# Patient Record
Sex: Female | Born: 1935 | Race: White | Hispanic: No | Smoking: Never smoker
Health system: Southern US, Community
[De-identification: ages and names within clinical notes are randomized; demographics above are authoritative.]

## PROBLEM LIST (undated history)

## (undated) DIAGNOSIS — N289 Disorder of kidney and ureter, unspecified: Secondary | ICD-10-CM

## (undated) DIAGNOSIS — Z96659 Presence of unspecified artificial knee joint: Secondary | ICD-10-CM

## (undated) DIAGNOSIS — M545 Low back pain, unspecified: Secondary | ICD-10-CM

## (undated) DIAGNOSIS — R531 Weakness: Secondary | ICD-10-CM

## (undated) DIAGNOSIS — T8459XA Infection and inflammatory reaction due to other internal joint prosthesis, initial encounter: Secondary | ICD-10-CM

## (undated) DIAGNOSIS — R6251 Failure to thrive (child): Secondary | ICD-10-CM

## (undated) DIAGNOSIS — F329 Major depressive disorder, single episode, unspecified: Secondary | ICD-10-CM

## (undated) DIAGNOSIS — F32A Depression, unspecified: Secondary | ICD-10-CM

## (undated) DIAGNOSIS — N186 End stage renal disease: Secondary | ICD-10-CM

## (undated) DIAGNOSIS — I10 Essential (primary) hypertension: Secondary | ICD-10-CM

## (undated) DIAGNOSIS — L8991 Pressure ulcer of unspecified site, stage 1: Secondary | ICD-10-CM

## (undated) HISTORY — DX: Infection and inflammatory reaction due to other internal joint prosthesis, initial encounter: T84.59XA

## (undated) HISTORY — DX: Pressure ulcer of unspecified site, stage 1: L89.91

## (undated) HISTORY — PX: REPLACEMENT TOTAL KNEE BILATERAL: SUR1225

## (undated) HISTORY — DX: Presence of unspecified artificial knee joint: Z96.659

## (undated) HISTORY — PX: PERITONEAL SHUNT: SHX2225

## (undated) HISTORY — PX: PATELLECTOMY: SHX1022

## (undated) HISTORY — DX: Essential (primary) hypertension: I10

## (undated) HISTORY — PX: ABDOMINAL HYSTERECTOMY: SHX81

## (undated) HISTORY — DX: End stage renal disease: N18.6

---

## 1998-07-27 ENCOUNTER — Ambulatory Visit (HOSPITAL_COMMUNITY): Admission: RE | Admit: 1998-07-27 | Discharge: 1998-07-27 | Payer: Self-pay | Admitting: Neurological Surgery

## 1998-08-10 ENCOUNTER — Ambulatory Visit (HOSPITAL_COMMUNITY): Admission: RE | Admit: 1998-08-10 | Discharge: 1998-08-10 | Payer: Self-pay | Admitting: Neurological Surgery

## 1998-08-10 ENCOUNTER — Encounter: Payer: Self-pay | Admitting: Neurological Surgery

## 1998-08-24 ENCOUNTER — Ambulatory Visit (HOSPITAL_COMMUNITY): Admission: RE | Admit: 1998-08-24 | Discharge: 1998-08-24 | Payer: Self-pay | Admitting: Neurological Surgery

## 1998-08-24 ENCOUNTER — Encounter: Payer: Self-pay | Admitting: Neurological Surgery

## 1998-09-15 ENCOUNTER — Ambulatory Visit (HOSPITAL_COMMUNITY): Admission: RE | Admit: 1998-09-15 | Discharge: 1998-09-15 | Payer: Self-pay | Admitting: Neurological Surgery

## 1998-09-15 ENCOUNTER — Encounter: Payer: Self-pay | Admitting: Neurological Surgery

## 1998-09-28 ENCOUNTER — Ambulatory Visit (HOSPITAL_COMMUNITY): Admission: RE | Admit: 1998-09-28 | Discharge: 1998-09-28 | Payer: Self-pay | Admitting: Neurosurgery

## 1999-01-14 ENCOUNTER — Encounter: Payer: Self-pay | Admitting: Neurological Surgery

## 1999-01-14 ENCOUNTER — Ambulatory Visit (HOSPITAL_COMMUNITY): Admission: RE | Admit: 1999-01-14 | Discharge: 1999-01-14 | Payer: Self-pay | Admitting: Neurological Surgery

## 1999-02-23 ENCOUNTER — Encounter: Payer: Self-pay | Admitting: Neurological Surgery

## 1999-02-25 ENCOUNTER — Inpatient Hospital Stay (HOSPITAL_COMMUNITY): Admission: RE | Admit: 1999-02-25 | Discharge: 1999-03-03 | Payer: Self-pay | Admitting: Neurological Surgery

## 1999-02-25 ENCOUNTER — Encounter: Payer: Self-pay | Admitting: Neurological Surgery

## 2001-07-10 ENCOUNTER — Encounter (HOSPITAL_COMMUNITY): Admission: RE | Admit: 2001-07-10 | Discharge: 2001-08-09 | Payer: Self-pay | Admitting: Orthopaedic Surgery

## 2006-11-13 ENCOUNTER — Emergency Department (HOSPITAL_COMMUNITY): Admission: EM | Admit: 2006-11-13 | Discharge: 2006-11-14 | Payer: Self-pay | Admitting: Emergency Medicine

## 2006-11-14 ENCOUNTER — Emergency Department (HOSPITAL_COMMUNITY): Admission: EM | Admit: 2006-11-14 | Discharge: 2006-11-14 | Payer: Self-pay | Admitting: Emergency Medicine

## 2007-02-22 ENCOUNTER — Emergency Department (HOSPITAL_COMMUNITY): Admission: EM | Admit: 2007-02-22 | Discharge: 2007-02-22 | Payer: Self-pay | Admitting: Emergency Medicine

## 2007-09-03 ENCOUNTER — Ambulatory Visit (HOSPITAL_COMMUNITY): Admission: RE | Admit: 2007-09-03 | Discharge: 2007-09-03 | Payer: Self-pay | Admitting: Orthopaedic Surgery

## 2007-09-21 ENCOUNTER — Ambulatory Visit (HOSPITAL_COMMUNITY): Payer: Self-pay | Admitting: Orthopaedic Surgery

## 2007-09-21 ENCOUNTER — Encounter (HOSPITAL_COMMUNITY): Admission: RE | Admit: 2007-09-21 | Discharge: 2007-10-17 | Payer: Self-pay | Admitting: Oncology

## 2007-10-08 ENCOUNTER — Inpatient Hospital Stay: Admission: AD | Admit: 2007-10-08 | Discharge: 2007-10-26 | Payer: Self-pay | Admitting: Family Medicine

## 2007-10-26 ENCOUNTER — Inpatient Hospital Stay (HOSPITAL_COMMUNITY): Admission: EM | Admit: 2007-10-26 | Discharge: 2007-10-29 | Payer: Self-pay | Admitting: Emergency Medicine

## 2007-10-29 ENCOUNTER — Inpatient Hospital Stay: Admission: AD | Admit: 2007-10-29 | Discharge: 2007-11-23 | Payer: Self-pay | Admitting: Family Medicine

## 2010-07-21 ENCOUNTER — Inpatient Hospital Stay
Admission: AD | Admit: 2010-07-21 | Discharge: 2010-10-14 | Disposition: A | Discharge status: Other Acute Inpatient Hospital | Payer: Self-pay | Source: Home / Self Care | Attending: Family Medicine | Admitting: Family Medicine

## 2010-07-25 ENCOUNTER — Emergency Department (HOSPITAL_COMMUNITY): Admission: EM | Admit: 2010-07-25 | Discharge: 2010-07-26 | Payer: Self-pay | Admitting: Emergency Medicine

## 2010-08-03 ENCOUNTER — Ambulatory Visit (HOSPITAL_COMMUNITY): Admission: RE | Admit: 2010-08-03 | Discharge: 2010-08-03 | Payer: Self-pay | Admitting: Family Medicine

## 2010-08-26 ENCOUNTER — Ambulatory Visit (HOSPITAL_COMMUNITY): Admission: RE | Admit: 2010-08-26 | Discharge: 2010-08-26 | Payer: Self-pay | Admitting: Family Medicine

## 2010-10-14 ENCOUNTER — Inpatient Hospital Stay (HOSPITAL_COMMUNITY)
Admission: EM | Admit: 2010-10-14 | Discharge: 2010-10-16 | Payer: Self-pay | Source: Home / Self Care | Attending: Family Medicine | Admitting: Family Medicine

## 2010-10-16 ENCOUNTER — Inpatient Hospital Stay
Admission: RE | Admit: 2010-10-16 | Discharge: 2011-03-09 | DRG: 948 | Disposition: A | Discharge status: Home / Self Care | Payer: BC Managed Care – PPO | Source: Ambulatory Visit | Attending: Family Medicine | Admitting: Family Medicine

## 2010-10-16 DIAGNOSIS — Z139 Encounter for screening, unspecified: Principal | ICD-10-CM | POA: Diagnosis present

## 2010-11-05 NOTE — H&P (Signed)
NAME:  [name], [name]              ACCOUNT NO.:  [id number]  MEDICAL RECORD NO.:  [id number]          PATIENT TYPE:  INP  LOCATION:  A327                          FACILITY:  [facility]  PHYSICIAN:  Mila Homer. Sudie Bailey, M.D.DATE OF BIRTH:  1930-09-02  DATE OF ADMISSION:  10/14/2010 DATE OF DISCHARGE:  12/31/2011LH                             HISTORY & PHYSICAL   HISTORY OF PRESENT ILLNESS:  This 77 year old woman was recent admitted to [facility] with hyperkalemia and renal insufficiency.  She had a 3-day hospitalization extending from December 29 through December 31 and was discharged back to the [facility] at that time.  Long-term problems include chronic infection of the left knee.  She has had total knees placed in the knee twice now and both times has developed infection.  She is followed at [facility] for this.  She also has had chronic asthma, benign essential hypertension, lumbago, degenerative joint disease of both knees status post bilateral total knees and now chronic renal insufficiency.  She says she feels well today.  She said she feels she is breathing well, drinking enough fluid and certainly urinating adequately.  She is tired of being in the nursing home.  OBJECTIVE:  She is supine in bed.  She had been having a bath.  She is in no acute distress.  She is well-developed, slightly obese.  Her speech is normal.  Sentence structure is intact.  Her lungs are clear throughout the day.  Her heart is a regular rhythm and rate of about 80. There is no axillary or supraclavicular adenopathy.  Abdomen: Soft without organomegaly, mass or tenderness.  Bowel sounds are normal. There is no edema of the ankles.  She has bilateral vertical scars of the anterior aspects of both knees.  ASSESSMENT: 1. Recent hyperkalemia. 2. Chronically renal insufficiency, question etiology. 3. Anemia probably secondary to chronic renal insufficiency. 4. Status post chronic infection of  the left knee now still on p.o.     antibiotics. 5. Chronic asthma. 6. Benign essential hypertension. 7. Lumbago. 8. Degenerative joint disease of the knees status post bilateral total     knees. 9. Depression.  PLAN:  She is on ampicillin 500 mg b.i.d. until January 28.  I talked to nursing about this and at this point that is to be stopped followed by a visit at [facility] 3 weeks later and if she does well off antibiotics she will be ready for replacement of a total knee in the left.  MEDICATIONS:  She is to continue Advair Diskus puff b.i.d., atenolol 25 mg daily, Celexa 40 mg daily for depression, hydrochlorothiazide 25 mg daily, losartan 50 mg daily, Lufyllin 200 mg daily, oxybutynin 5 mg daily, Vicodin 5/500 q.i.d. for pain, alprazolam 0.25 mg q.h.s. for sleep and albuterol inhaler 2 puffs q.4 h. for breathing.  We are making arrangements for her to see a nephrologist in town.  I have discussed all this with her.     Mila Homer. Sudie Bailey, M.D.     SDK/MEDQ  D:  10/21/2010  T:  10/21/2010  Job:  [id number]  Electronically Signed by John Giovanni M.D. on 11/05/2010  07:09:58 AM

## 2010-11-06 ENCOUNTER — Encounter: Payer: Self-pay | Admitting: Family Medicine

## 2010-11-07 ENCOUNTER — Encounter: Payer: Self-pay | Admitting: Family Medicine

## 2010-11-07 ENCOUNTER — Encounter: Payer: Self-pay | Admitting: Orthopaedic Surgery

## 2010-11-30 ENCOUNTER — Encounter: Payer: Self-pay | Admitting: Nephrology

## 2010-12-04 NOTE — Progress Notes (Signed)
  NAME:  [name], MELUCCI              ACCOUNT NO.:  [id number]  MEDICAL RECORD NO.:  [id number]           PATIENT TYPE:  LOCATION:                                 FACILITY:  PHYSICIAN:  Mila Homer. Sudie Bailey, M.D.DATE OF BIRTH:  1930/03/13  DATE OF PROCEDURE: DATE OF DISCHARGE:                                PROGRESS NOTE   SUBJECTIVE:  Generally she is doing well.  She is getting bored staying in the nursing home, but had no specific complaints except for a little itchiness of the skin.  I talked to her personal nurse, who said that really she is doing quite well.  She is off antibiotics now.  She is also off all pain medications.  She is going to physical therapy.  She sees Dr. Kristian Covey, nephrologist for followup.  OBJECTIVE:  GENERAL:  She is sitting up in bed eating breakfast.  She is in no acute distress, well-developed, well-nourished, oriented and alert. HEART:  Regular rhythm and rate of about 80. LUNGS:  Clear throughout.  She is moving air well. ABDOMEN:  Soft without tenderness. EXTREMITIES:  There is no edema of the right leg.  The left leg is still healing from the infection. SKIN:  Quite dry, a little scaly.  ASSESSMENT: 1. Infected left total knee, status post removal of the total knee     with spacer placed. 2. Asthma. 3. Renal insufficiency, question etiology. 4. Dry skin.  PLAN:  Follow up with the nephrologist for the renal insufficiency and follow up at [facility] for her knee.     Mila Homer. Sudie Bailey, M.D.     SDK/MEDQ  D:  11/27/2010  T:  11/27/2010  Job:  [id number]  Electronically Signed by John Giovanni M.D. on 12/04/2010 05:25:06 AM

## 2010-12-27 LAB — BASIC METABOLIC PANEL
BUN: 59 mg/dL — ABNORMAL HIGH (ref 6–23)
BUN: 61 mg/dL — ABNORMAL HIGH (ref 6–23)
BUN: 66 mg/dL — ABNORMAL HIGH (ref 6–23)
BUN: 76 mg/dL — ABNORMAL HIGH (ref 6–23)
CO2: 17 mEq/L — ABNORMAL LOW (ref 19–32)
CO2: 17 mEq/L — ABNORMAL LOW (ref 19–32)
CO2: 18 mEq/L — ABNORMAL LOW (ref 19–32)
CO2: 18 mEq/L — ABNORMAL LOW (ref 19–32)
Calcium: 8.7 mg/dL (ref 8.4–10.5)
Calcium: 8.8 mg/dL (ref 8.4–10.5)
Calcium: 8.8 mg/dL (ref 8.4–10.5)
Calcium: 8.9 mg/dL (ref 8.4–10.5)
Calcium: 9.1 mg/dL (ref 8.4–10.5)
Chloride: 112 mEq/L (ref 96–112)
Chloride: 112 mEq/L (ref 96–112)
Chloride: 112 mEq/L (ref 96–112)
Chloride: 113 mEq/L — ABNORMAL HIGH (ref 96–112)
Chloride: 113 mEq/L — ABNORMAL HIGH (ref 96–112)
Creatinine, Ser: 3.54 mg/dL — ABNORMAL HIGH (ref 0.4–1.2)
Creatinine, Ser: 3.55 mg/dL — ABNORMAL HIGH (ref 0.4–1.2)
Creatinine, Ser: 3.63 mg/dL — ABNORMAL HIGH (ref 0.4–1.2)
Creatinine, Ser: 3.93 mg/dL — ABNORMAL HIGH (ref 0.4–1.2)
GFR calc Af Amer: 13 mL/min — ABNORMAL LOW (ref >60)
GFR calc Af Amer: 13 mL/min — ABNORMAL LOW (ref >60)
GFR calc Af Amer: 15 mL/min — ABNORMAL LOW (ref >60)
GFR calc Af Amer: 15 mL/min — ABNORMAL LOW (ref >60)
GFR calc Af Amer: 15 mL/min — ABNORMAL LOW (ref >60)
GFR calc non Af Amer: 11 mL/min — ABNORMAL LOW (ref >60)
GFR calc non Af Amer: 11 mL/min — ABNORMAL LOW (ref >60)
GFR calc non Af Amer: 12 mL/min — ABNORMAL LOW (ref >60)
GFR calc non Af Amer: 12 mL/min — ABNORMAL LOW (ref >60)
GFR calc non Af Amer: 12 mL/min — ABNORMAL LOW (ref >60)
Glucose, Bld: 123 mg/dL — ABNORMAL HIGH (ref 70–99)
Glucose, Bld: 92 mg/dL (ref 70–99)
Glucose, Bld: 92 mg/dL (ref 70–99)
Glucose, Bld: 94 mg/dL (ref 70–99)
Potassium: 4.3 mEq/L (ref 3.5–5.1)
Potassium: 4.5 mEq/L (ref 3.5–5.1)
Potassium: 4.7 mEq/L (ref 3.5–5.1)
Potassium: 5.9 mEq/L — ABNORMAL HIGH (ref 3.5–5.1)
Potassium: 6.6 mEq/L (ref 3.5–5.1)
Sodium: 138 mEq/L (ref 135–145)
Sodium: 138 mEq/L (ref 135–145)
Sodium: 139 mEq/L (ref 135–145)
Sodium: 139 mEq/L (ref 135–145)
Sodium: 139 mEq/L (ref 135–145)

## 2010-12-27 LAB — COMPREHENSIVE METABOLIC PANEL
Albumin: 3.1 g/dL — ABNORMAL LOW (ref 3.5–5.2)
BUN: 82 mg/dL — ABNORMAL HIGH (ref 6–23)
CO2: 18 mEq/L — ABNORMAL LOW (ref 19–32)
Calcium: 9.2 mg/dL (ref 8.4–10.5)
Chloride: 112 mEq/L (ref 96–112)
Creatinine, Ser: 4.25 mg/dL — ABNORMAL HIGH (ref 0.4–1.2)
GFR calc non Af Amer: 10 mL/min — ABNORMAL LOW (ref >60)
Potassium: 6.9 mEq/L (ref 3.5–5.1)
Sodium: 138 mEq/L (ref 135–145)
Total Protein: 7.5 g/dL (ref 6.0–8.3)

## 2010-12-27 LAB — CBC: RDW: 18.5 % — ABNORMAL HIGH (ref 11.5–15.5)

## 2010-12-27 LAB — DIFFERENTIAL
Basophils Absolute: 0 10*3/uL (ref 0.0–0.1)
Basophils Relative: 0 % (ref 0–1)
Eosinophils Relative: 4 % (ref 0–5)
Monocytes Relative: 7 % (ref 3–12)

## 2010-12-27 LAB — CREATININE, URINE, RANDOM: Creatinine, Urine: 45.28 mg/dL

## 2010-12-27 LAB — SODIUM, URINE, RANDOM: Sodium, Ur: 93 mEq/L

## 2010-12-30 LAB — BASIC METABOLIC PANEL
CO2: 28 mEq/L (ref 19–32)
GFR calc non Af Amer: 41 mL/min — ABNORMAL LOW (ref >60)
Glucose, Bld: 136 mg/dL — ABNORMAL HIGH (ref 70–99)
Potassium: 3.8 mEq/L (ref 3.5–5.1)

## 2010-12-30 LAB — DIFFERENTIAL
Basophils Absolute: 0.2 10*3/uL — ABNORMAL HIGH (ref 0.0–0.1)
Eosinophils Absolute: 0.6 10*3/uL (ref 0.0–0.7)
Monocytes Absolute: 1.1 10*3/uL — ABNORMAL HIGH (ref 0.1–1.0)
Monocytes Relative: 6 % (ref 3–12)

## 2010-12-30 LAB — CBC
MCHC: 33.3 g/dL (ref 30.0–36.0)
MCV: 91.6 fL (ref 78.0–100.0)
Platelets: 387 10*3/uL (ref 150–400)
WBC: 18.4 10*3/uL — ABNORMAL HIGH (ref 4.0–10.5)

## 2011-02-02 ENCOUNTER — Encounter (HOSPITAL_COMMUNITY): Payer: Medicare Other | Attending: Family Medicine

## 2011-02-02 ENCOUNTER — Other Ambulatory Visit: Payer: Self-pay | Admitting: Family Medicine

## 2011-02-02 DIAGNOSIS — N189 Chronic kidney disease, unspecified: Secondary | ICD-10-CM | POA: Insufficient documentation

## 2011-02-02 DIAGNOSIS — I129 Hypertensive chronic kidney disease with stage 1 through stage 4 chronic kidney disease, or unspecified chronic kidney disease: Secondary | ICD-10-CM | POA: Insufficient documentation

## 2011-02-02 DIAGNOSIS — D631 Anemia in chronic kidney disease: Secondary | ICD-10-CM | POA: Insufficient documentation

## 2011-02-02 DIAGNOSIS — Z139 Encounter for screening, unspecified: Secondary | ICD-10-CM | POA: Diagnosis present

## 2011-02-03 ENCOUNTER — Encounter (HOSPITAL_COMMUNITY): Payer: Medicare Other

## 2011-02-03 ENCOUNTER — Encounter (HOSPITAL_COMMUNITY): Payer: BC Managed Care – PPO

## 2011-02-04 LAB — CROSSMATCH
ABO/RH(D): O POS
Antibody Screen: NEGATIVE
Unit division: 0
Unit division: 0

## 2011-02-17 ENCOUNTER — Ambulatory Visit (HOSPITAL_COMMUNITY): Admission: RE | Admit: 2011-02-17 | Payer: Medicare Other | Source: Ambulatory Visit | Admitting: Vascular Surgery

## 2011-02-17 ENCOUNTER — Ambulatory Visit (HOSPITAL_COMMUNITY)
Admission: RE | Admit: 2011-02-17 | Discharge: 2011-02-17 | Disposition: A | Discharge status: Home / Self Care | Payer: Medicare Other | Source: Ambulatory Visit | Attending: Vascular Surgery | Admitting: Vascular Surgery

## 2011-02-17 ENCOUNTER — Ambulatory Visit (HOSPITAL_COMMUNITY): Payer: Medicare Other | Attending: Vascular Surgery

## 2011-02-17 DIAGNOSIS — I120 Hypertensive chronic kidney disease with stage 5 chronic kidney disease or end stage renal disease: Secondary | ICD-10-CM

## 2011-02-17 DIAGNOSIS — N186 End stage renal disease: Secondary | ICD-10-CM

## 2011-02-17 DIAGNOSIS — I7090 Unspecified atherosclerosis: Secondary | ICD-10-CM | POA: Insufficient documentation

## 2011-02-17 DIAGNOSIS — Z9889 Other specified postprocedural states: Secondary | ICD-10-CM | POA: Insufficient documentation

## 2011-02-17 DIAGNOSIS — I517 Cardiomegaly: Secondary | ICD-10-CM | POA: Insufficient documentation

## 2011-02-17 LAB — POCT I-STAT 4, (NA,K, GLUC, HGB,HCT)
Glucose, Bld: 86 mg/dL (ref 70–99)
Potassium: 5.8 mEq/L — ABNORMAL HIGH (ref 3.5–5.1)
Sodium: 138 mEq/L (ref 135–145)

## 2011-02-17 LAB — SURGICAL PCR SCREEN: Staphylococcus aureus: POSITIVE — AB

## 2011-02-19 ENCOUNTER — Emergency Department (HOSPITAL_COMMUNITY): Payer: Medicare Other

## 2011-02-19 ENCOUNTER — Inpatient Hospital Stay (HOSPITAL_COMMUNITY): Payer: Medicare Other

## 2011-02-19 ENCOUNTER — Ambulatory Visit (HOSPITAL_COMMUNITY)
Admission: EM | Admit: 2011-02-19 | Discharge: 2011-02-19 | Disposition: A | Discharge status: Home / Self Care | Payer: Medicare Other | Source: Ambulatory Visit | Attending: Emergency Medicine | Admitting: Emergency Medicine

## 2011-02-19 DIAGNOSIS — J449 Chronic obstructive pulmonary disease, unspecified: Secondary | ICD-10-CM | POA: Insufficient documentation

## 2011-02-19 DIAGNOSIS — Z79899 Other long term (current) drug therapy: Secondary | ICD-10-CM | POA: Insufficient documentation

## 2011-02-19 DIAGNOSIS — Z96659 Presence of unspecified artificial knee joint: Secondary | ICD-10-CM | POA: Insufficient documentation

## 2011-02-19 DIAGNOSIS — J4489 Other specified chronic obstructive pulmonary disease: Secondary | ICD-10-CM | POA: Insufficient documentation

## 2011-02-19 DIAGNOSIS — I120 Hypertensive chronic kidney disease with stage 5 chronic kidney disease or end stage renal disease: Secondary | ICD-10-CM | POA: Insufficient documentation

## 2011-02-19 DIAGNOSIS — N186 End stage renal disease: Secondary | ICD-10-CM | POA: Insufficient documentation

## 2011-02-19 DIAGNOSIS — M1990 Unspecified osteoarthritis, unspecified site: Secondary | ICD-10-CM | POA: Insufficient documentation

## 2011-02-19 DIAGNOSIS — R0602 Shortness of breath: Secondary | ICD-10-CM | POA: Insufficient documentation

## 2011-02-19 DIAGNOSIS — I509 Heart failure, unspecified: Secondary | ICD-10-CM | POA: Insufficient documentation

## 2011-02-19 LAB — BASIC METABOLIC PANEL
BUN: 41 mg/dL — ABNORMAL HIGH (ref 6–23)
CO2: 24 mEq/L (ref 19–32)
Chloride: 103 mEq/L (ref 96–112)
Creatinine, Ser: 3.49 mg/dL — ABNORMAL HIGH (ref 0.4–1.2)
Glucose, Bld: 92 mg/dL (ref 70–99)
Potassium: 5 mEq/L (ref 3.5–5.1)

## 2011-02-19 LAB — CBC
HCT: 30.1 % — ABNORMAL LOW (ref 36.0–46.0)
Hemoglobin: 9.7 g/dL — ABNORMAL LOW (ref 12.0–15.0)
MCH: 30.4 pg (ref 26.0–34.0)
MCV: 94.4 fL (ref 78.0–100.0)
RBC: 3.19 MIL/uL — ABNORMAL LOW (ref 3.87–5.11)
WBC: 17.2 10*3/uL — ABNORMAL HIGH (ref 4.0–10.5)

## 2011-02-19 LAB — PRO B NATRIURETIC PEPTIDE: Pro B Natriuretic peptide (BNP): 5328 pg/mL — ABNORMAL HIGH (ref 0–450)

## 2011-02-19 LAB — DIFFERENTIAL
Lymphocytes Relative: 4 % — ABNORMAL LOW (ref 12–46)
Lymphs Abs: 0.7 10*3/uL (ref 0.7–4.0)
Monocytes Relative: 5 % (ref 3–12)
Neutrophils Relative %: 87 % — ABNORMAL HIGH (ref 43–77)

## 2011-02-19 LAB — POCT CARDIAC MARKERS: Troponin i, poc: <0.05 ng/mL (ref 0.00–0.09)

## 2011-02-22 NOTE — Op Note (Signed)
  NAME:  [name], [name]              ACCOUNT NO.:  [id number]  MEDICAL RECORD NO.:  [id number]           PATIENT TYPE:  O  LOCATION:  SDS                          FACILITY:  [facility]  PHYSICIAN:  Di Kindle. Edilia Bo, M.D.DATE OF BIRTH:  [birth year]  DATE OF PROCEDURE:  02/17/2011 DATE OF DISCHARGE:                              OPERATIVE REPORT   PREOPERATIVE DIAGNOSIS:  Chronic kidney disease.  POSTOPERATIVE DIAGNOSIS:  Chronic kidney disease.  PROCEDURE:  Ultrasound-guided placement of right IJ 19-cm Diatek catheter.  SURGEON:  Di Kindle. Edilia Bo, MD  ANESTHESIA:  Local with sedation.  TECHNIQUE:  The patient was taken to the operating room, sedated by Anesthesia.  The neck and upper chest were prepped and draped in usual sterile fashion.  After the skin was anesthetized with 1% lidocaine and with the patient in Trendelenburg and under ultrasound guidance, the right IJ was cannulated and the guidewire introduced into the superior vena cava under fluoroscopic control.  The track over the wire was dilated, and the dilator and peel-away sheath were advanced over the wire and the wire and dilator were removed.  Catheter was passed through the peel-away sheath and positioned in the right atrium.  The exit site in the catheter was selected, and the skin anesthetized between the two areas.  The catheter was then brought through the tunnel, cut to the appropriate length, and the distal ports were attached.  Both ports withdrew easily.  We then flushed with heparinized saline and filled with concentrated heparin.  The catheter was secured at its exit site with 3-0 nylon suture.  The IJ cannulation site was closed with a 4-0 subcuticular stitch.  Sterile dressing was applied.  The patient tolerated the procedure well, was transferred to the recovery room in stable condition.  All needle and sponge counts were correct.     Di Kindle. Edilia Bo, M.D.     CSD/MEDQ  D:   02/17/2011  T:  02/17/2011  Job:  [id number]  Electronically Signed by Waverly Ferrari M.D. on 02/22/2011 04:54:19 PM

## 2011-03-01 NOTE — Group Therapy Note (Signed)
NAME:  [name], [name]              ACCOUNT NO.:  [id number]   MEDICAL RECORD NO.:  [id number]          PATIENT TYPE:  INP   LOCATION:  A215                          FACILITY:  [facility]   PHYSICIAN:  Edward L. Juanetta Gosling, M.D.DATE OF BIRTH:  [birth year]   DATE OF PROCEDURE:  DATE OF DISCHARGE:                                 PROGRESS NOTE   SUBJECTIVE:  [name] is better.  Says she is feeling a little better.  She has no new complaints.   OBJECTIVE:  Shows she is awake, alert.  Temperature is 97.4, pulse 84,  respirations 20, blood pressure 161/83, O2 sats 98% on room air.  Her B-  met shows potassium is 4.2, BUN 13, creatinine 0.92, so she is back to  normal with that.  Her white count 7900, hemoglobin is 8.6.   ASSESSMENT:  She is better.  Her potassium is better.   PLAN:  To see if perhaps she can get back to the nursing home soon.  She  is still on diltiazem drip which I am going to discontinue.      Edward L. Juanetta Gosling, M.D.  Electronically Signed     ELH/MEDQ  D:  10/28/2007  T:  10/29/2007  Job:  [id number]

## 2011-03-01 NOTE — H&P (Signed)
NAME:  [name], [name]              ACCOUNT NO.:  [id number]   MEDICAL RECORD NO.:  [id number]          PATIENT TYPE:  ORB   LOCATION:  S102                          FACILITY:  [facility]   PHYSICIAN:  Mila Homer. Sudie Bailey, M.D.DATE OF BIRTH:  03/24/1930   DATE OF ADMISSION:  10/29/2007  DATE OF DISCHARGE:  LH                              HISTORY & PHYSICAL   HISTORY OF PRESENT ILLNESS:  This 77-year was readmitted from [facility] nursing home after three hospitalization at [facility]  for severe hyperkalemia.  She had potassium of 7.1 at that time.   She is now back in the nursing doing well.   Her history includes hypertension, asthma, and DJD of the knees.  She  had bilateral knee surgeries and recently infected left total knee which  required operative treatment at [facility] with removal of the  hardware in the knee and now 6 weeks of IV vancomycin.   She has also had a chronic anemia and chronic pain due to lumbar disk  disease.   After leaving the hospital she was continued on amlodipine 10 mg daily.  She had been on amlodipine 10/benazepril 20 daily prior to this and had  not been on ACE  prior to the time at the nursing home.  Benazepril was  discontinued.  She was also put on HCTZ 50 mg daily in an attempt to  keep her potassium down also to control her blood pressure.  She was  continued with vancomycin as above at [place] twice a day.   PHYSICAL EXAMINATION:  GENERAL:  She is well-developed, well-nourished  oriented and alert.  No acute distress.  VITAL SIGNS:  Temperature is 98.7, pulse 70, BP 137/50, respiratory rate  20.  LUNGS:  Her lungs are clear throughout moving air well.  HEART:  A regular rhythm with a rate of 70.  ABDOMEN:  Soft without organomegaly or mass.  There is no axillary or  supraclavicular adenopathy.  SKIN:  Skin turgor is normal.  Mucous membranes are moist.  NEUROLOGIC:  She is a good historian and affect is normal.  EXTREMITIES:   There is no edema of the ankles.  There is a well-healing  surgical incision of the anterior surface of the left knee.   LABORATORY DATA:  Recheck potassium yesterday was 3.6.  A random glucose  was 142 and review of the hospital January 101, 151 and 148.   ADMISSION DIAGNOSES:  1. Infection of the left total knee, now on 6 weeks of IV vancomycin.  2. Hyperkalemia, resolved and probably secondary to the use of      benazepril as an antihypertensive.  3. Chronic asthma.  4. Hyperglycemia.  5. Chronic anemia.  6. Benign essential hypertension.  7. Degenerative joint disease in the knees.  8. Lumbago secondary to degenerative disease of the lumbar spine.   PLAN:  Will continue her on the amlodipine 10 mg daily and HCTZ 50 mg  daily.  Use theophylline twice a day for her asthma.  She will be on IV  vancomycin as per pharmacy.  Given the persistent  elevated glucoses we  are getting an A1c on her.  Will repeat her potassium level in 2 days  and make sure it is staying down.  I discussed all this with the patient  and nursing.      Mila Homer. Sudie Bailey, M.D.  Electronically Signed     SDK/MEDQ  D:  11/01/2007  T:  11/01/2007  Job:  [id number]

## 2011-03-01 NOTE — H&P (Signed)
NAME:  [name], [name]              ACCOUNT NO.:  [id number]   MEDICAL RECORD NO.:  [id number]          PATIENT TYPE:  ORB   LOCATION:  S102                          FACILITY:  [facility]   PHYSICIAN:  Mila Homer. Sudie Bailey, M.D.DATE OF BIRTH:  [birth year]   DATE OF ADMISSION:  10/08/2007  DATE OF DISCHARGE:  LH                              HISTORY & PHYSICAL   This 77 year old woman was seen in the office about 3 or 4 weeks ago.  She had an area near her left knee which had become erythematous and  swollen, measured in size about a quarter. It was I&D'd in the office  and a copious amount of pus came from this. Given her history of left  total knee arthroplasty, it was recommended she be seen the following  day at which point I was going to take her C&S and refer it to  orthopedics. The patient did not come back that week however and it was  only the following week I was able to see her at which time again she  was still draining and I referred her to Dr. Darreld Mclean, orthopedist  in town.   She has a long and complicated medical history. She has had anxiety and  some depression. Her husband of 56 years just died 3 months ago. He had  MS and was taken care of at a rest home run by his church. She has  family in town.   She also has long-term asthma. She has been on Lufyllin for this.   After getting Vancomycin IV for 2 or 3 days outpatient, she was  transferred to [facility] and there they resected the hardware in  the left knee and also put an antibiotic space in place.   Today she is in no acute distress, oriented and alert, a well-developed  woman sitting up in bed. Her sentence structure is intact. The patient's  sensorium appears to be normal. She really has no pain at present.  Mucous membranes are moist, skin turgor is normal, there is no axillary  or supraclavicular adenopathy. Negative anterior cervical nodes.  ENT:  Appeared normal.  HEART:  Regular rhythm and rate of  80.  LUNGS:  Clear, moving air well. There were no intercostal retractions or  use of accessory muscles for respiration. There was no wheezing despite  her long history of asthma.  ABDOMEN:  Soft and obese without hepatosplenomegaly or mass or  tenderness.  EXTREMITIES:  She had a left knee immobilizer in place and no distal  edema of the ankles.   ASSESSMENT:  1. Degenerative joint disease status post left total knee.  2. Infection left total knee status post removal of the hardware at      [facility].  3. Right total knee.  4. Chronic asthma.  5. Anxiety.  6. Hypertension.  7. Constipation.   I recommended she getting on Lufyllin or similar product 200-300 mg  b.i.d. She is on Xopenex MDI 2 puffs q.4 h p.r.n. She is on p.r.n.  acetaminophen. She is on IV Vancomycin with Vancomycin level to be done  daily.  She is on Lotrel 5/20 daily for hypertension, aspirin 325 mg  daily, Colace 100 mg daily, doxicycline 100 mg b.i.d., Advair diskus  100/50 puff b.i.d. She has a PICC line.   Followup will be with orthopedics and ID at [facility] in several  weeks.      Mila Homer. Sudie Bailey, M.D.  Electronically Signed     SDK/MEDQ  D:  10/10/2007  T:  10/10/2007  Job:  [id number]

## 2011-03-01 NOTE — Discharge Summary (Signed)
NAME:  [name], [name]              ACCOUNT NO.:  [id number]   MEDICAL RECORD NO.:  [id number]          PATIENT TYPE:  INP   LOCATION:  A215                          FACILITY:  [facility]   PHYSICIAN:  Mila Homer. Sudie Bailey, M.D.DATE OF BIRTH:  Jul 21, 1930   DATE OF ADMISSION:  DATE OF DISCHARGE:  LH                               DISCHARGE SUMMARY   This 77 year old woman was admitted to the hospital with hyperkalemia.  She had a benign 4-day hospitalization extending from October 26, 2007 to  October 29, 2007.  Vital signs remain stable.   Admission CBC showed an H&H of 8.9/27.3, and a BMET:  potassium 7.3,  glucose 113, BUN 33, creatinine 1.31.  Cardiac markers were negative,  and recheck potassium later that afternoon was 6.7.  Following day, the  hemoglobin was stable at 8.9 and the potassium was down to 5.5.  Potassium then dropped to 4.2 the day prior to discharge, and the day of  discharge it was 3.6.   She was admitted to the hospital, kept on a vancomycin protocol.  She  had been on amlodipine 10/benazepril 20.  The benazepril was stopped and  she put on amlodipine 10 mg daily, and HCTZ 50 mg daily added for blood  pressure control.  We kept her on __________  200 mg b.i.d., aspirin 325  mg daily, Colace 100 mg daily, and she had p.r.n., hydrocodone and  acetaminophen, Ambien 10 mg nightly for sleep, and Benadryl 25 mg q.  shift for itching.   In the emergency room, she was given D50 IV with IV insulin and calcium.  She was also given 60 grams of Kayexalate.  She had 40-g dose of  Kayexalate given at 6 p.m. that night when her potassium was still up at  6.7.  She is on IV of normal saline 75 mL/hour.   Shortly after this she, developed what appeared to be an atrial  tachycardia, then atrial fibrillation, so she was put on a Cardizem  drip.  She required another 60 g of Kayexalate the following day, and  that was given on Cardizem by protocol.  Cardizem drip was discontinued  her  third hospital day.   She was doing well on fourth hospital day.  Potassium down to 3.6.  She  felt well, but her blood pressure was somewhat elevated at 178/88.  Other vitals were stable.  She was felt to be ready to return back to  the [facility].   She is to continue with her aspirin 325 mg daily, Colace 100 mg daily,  MVI with minerals daily, Mylanta 30 mL q.4 hours for heartburn,  Phenergan 25 mg suppository q.4 hours for nausea, Ambien 10 mg nightly  for sleep, Benadryl 25 mg q. shift as needed, and vancomycin 750 mg IV  every 36 hours by protocol.  She is also to be on hydrocodone APAP  7.5/650 one to two every 4 hours for pain.  Instead of continuing her on  Lotrel 10/20, we are stopped the benazepril, continuing amlodipine 10 mg  daily, and HCTZ 50 mg daily.  We  will check her potassium level in 2  days and 4 days.   FINAL DISCHARGE DIAGNOSIS:  1. Hyperkalemia possibly secondary to benazepril.  2. Benign essential hypertension.  3. Degenerative joint disease of the knee, status post a left total      knee, and then left total knee infection with removal of the      hardware from this last month.  4. Chronic asthma.  5. Chronic anemia.  6. Chronic pain.      Mila Homer. Sudie Bailey, M.D.  Electronically Signed     SDK/MEDQ  D:  10/29/2007  T:  10/29/2007  Job:  [id number]

## 2011-03-01 NOTE — Discharge Summary (Signed)
NAME:  [name], [name]              ACCOUNT NO.:  [id number]   MEDICAL RECORD NO.:  [id number]          PATIENT TYPE:  ORB   LOCATION:  S102                          FACILITY:  [facility]   PHYSICIAN:  Mila Homer. Sudie Bailey, M.D.DATE OF BIRTH:  12/23/1929   DATE OF ADMISSION:  10/29/2007  DATE OF DISCHARGE:  LH                               DISCHARGE SUMMARY   A 77 year old woman was admitted to the nursing center in December in  recuperation from removal of infected left total knee prosthesis.  She  required admission to [facility] for three-day course starting  October 29, 2007, due to hyperkalemia, which was treated and resolved.  She has done well since then.   She had a month long course of IV vancomycin, was seen by orthopedist  recently and after that course was done, she was switched to Bactrim DS  two tablets b.i.d., which she currently is on.  She is at the point  where she will be ready to be discharged home this week with the family  to help at home.   CURRENT MEDICATIONS:  1. Aspirin 325 mg daily.  2. Colace 100 mg daily.  3. Multivitamin daily.  4. Amlodipine 10 mg daily.  5. Hydrochlorothiazide 50 mg daily.  6. Lufyllin 200 mg b.i.d.  7. Advair discus 100/50 puff b.i.d.  8. Benadryl 25 mg q.8h. for itching.  9. Lorcet Plus 7.5/650 q.4h. for pain.   PHYSICAL EXAMINATION:  GENERAL APPEARANCE:  A pleasant, elderly woman  who was in no acute distress.  She was well-developed, well-nourished.  Sensorium intact.  Sentence structure normal.  Brief exam normal.   DISCHARGE DIAGNOSIS:  1. Infection of the left total knee, now status post treatment with      six weeks of IV vancomycin and start of a month-long treatment of      Bactrim DS two b.i.d.  2. Hyperkalemia, probably secondary to ACE inhibition.  3. Chronic asthma.  4. Hyperglycemia.  5. Chronic anemia.  6. Benign essential hypertension.  7. Degenerative joint disease in knees.  8. Lumbago secondary to  degenerative disease of the lumbar spine.   DISCHARGE MEDICATIONS:  She is discharged home on meds as noted.  1. She is to stop her Lotrel which she currently has at home.  2. I have written RX for amlodipine 10 mg daily (75 refills), and      Bactrim DS 2 tablets b.i.d. (120 RF x0).   FOLLOW UP:  She is to follow-up with me in the office within a month and  will be seen by the doctors at [facility] on December 03, 2007.  She will  need another total knee surgery once the infection has totally calmed  down and she gets clearance from her orthopedist.  Now also to make a  change in the dictation above.      Mila Homer. Sudie Bailey, M.D.  Electronically Signed     SDK/MEDQ  D:  11/20/2007  T:  11/21/2007  Job:  [id number]

## 2011-03-01 NOTE — H&P (Signed)
NAME:  [name], [name]              ACCOUNT NO.:  [id number]   MEDICAL RECORD NO.:  [id number]          PATIENT TYPE:  INP   LOCATION:  A215                          FACILITY:  [facility]   PHYSICIAN:  Mila Homer. Sudie Bailey, M.D.DATE OF BIRTH:  1930-07-29   DATE OF ADMISSION:  10/26/2007  DATE OF DISCHARGE:  LH                              HISTORY & PHYSICAL   HISTORY OF PRESENT ILLNESS:  This is a 78 year old woman resident of the  [facility] had a routine CBC and MET 7 done today and was  found to have a potassium of 7.1.  This was repeated and was 7.2.   She is currently a resident at the nursing home short stay since she  developed infection of a left total knee roughly a month ago.  She had  the hardware removed at [facility] and currently is convalescing on IV  vancomycin.   She has had asthma, lifelong.  He also had hypertension.   Currently, she is on Lotrel 10/20 daily increased from Lotrel 5/20 daily  recently.  She is also on hydrocodone/acetaminophen for pain and  albuterol MDI.   She has had a long history of DJD of the knees before she had the left  total knee.  There is no history of alcohol or tobacco.  She has had a  number of trips to the [facility] ER in the last year including one for a  facial laceration in January 2008, and another for fracture of the left  rib in May 2008.   Currently, she is getting physical therapy using a walker at the nursing  facility.   REVIEW OF SYSTEMS:  She has had general fatigue for the last few days to  a week.  There is no specific pain other than the left knee.  She has a  history of hypertension.   PHYSICAL EXAMINATION:  VITAL SIGNS:  Temperature is 98 degrees, blood  pressure 161/41, pulse 95, respiratory 20, O2 saturations 95%.  GENERAL:  She is oriented and alert in no acute distress.  Her speech  was normal.  Sentence structure intact.  She appeared to be a good  historian.  HEENT:  Mucous membranes are moist.  Skin  turgor was normal.  There is  no axillary supraclavicular adenopathy.  HEART:  Regular rhythm and rate of 70.  LUNGS:  Clear throughout.  ABDOMEN:  Soft without hepatosplenomegaly or mass.  EXTREMITIES:  Left knee still and a draining catheter.   LABORATORY DATA AND X-RAY FINDINGS:  Her white cell count is 8100 of  which 73% are neutrophils, 10 lymphs, H&H 8.9 and 27.3.  Her BMET showed  potassium 7.3, BUN 33, creatinine 1.31, glucose 113.   IMPRESSION:  1. Severe hyperkalemia, questionable etiology.  This may be to some      extent secondary to slightly decreased renal function and the use      of benazepril.  2. Anemia, which is chronic with a hemoglobin 9.8 on December 5.  3. Benign essential hypertension.  4. Chronic asthma.  5. Degenerative joint disease of the knees.  6.  Chronic pain.   PLAN:  The patient was treated in the emergency department by Dr.  Colon Branch, the ER physician, with D-50 insulin and calcium.  She also  received 60 g of Kayexalate.  She is asymptomatic now, admitted on a  monitor and repeat potassium is pending.  Once her potassium is under  control and we understand the etiology, she will be able to return back  to the nursing center.      Mila Homer. Sudie Bailey, M.D.  Electronically Signed     SDK/MEDQ  D:  10/26/2007  T:  10/27/2007  Job:  [id number]

## 2011-03-01 NOTE — Group Therapy Note (Signed)
NAME:  [name], [name]              ACCOUNT NO.:  [id number]   MEDICAL RECORD NO.:  [id number]          PATIENT TYPE:  INP   LOCATION:  A215                          FACILITY:  [facility]   PHYSICIAN:  Edward L. Juanetta Gosling, M.D.DATE OF BIRTH:  [birth year]   DATE OF PROCEDURE:  10/27/2007  DATE OF DISCHARGE:                                 PROGRESS NOTE   A patient of Dr. Mila Homer. Knowlton's.  [name] was at the [facility] for rehab for knee replacement and then her knee got infected and  had to have all that removed and she is there for rehab.  She was found  to have an elevated potassium level.  Since then she has developed what  appears to be a paroxysmal atrial tachycardia.   Her temperature is 97.9, pulse 100, respirations 20, blood pressure  138/98, O2 sats 96%.  Her heart rate went to about 150.  She did receive  Cardizem and it has made some difference.  This morning her hemoglobin  level is 8.9, yesterday it was 8.9 so little change.  Her potassium is  down to 5.5 from about 7, BUN is 23, creatinine 1.04 so I am not sure  why she is having such an elevation of her potassium.  She is not on  potassium sparing diuretics.  She is not on an ACE or an ARB.  She is on  vancomycin.   ASSESSMENT:  She has infection in her knee.  She is being treated with  vancomycin.  She has atrial arrhythmias being treated with Cardizem.  She has hyperkalemia being treated.  She is going to need another dose  of Kayexalate and then try and decide what we are going to do from  there.  I am going to go ahead and get cardiac enzymes, EKGs serially,  repeat her Kayexalate, repeat her labs in the morning.      Edward L. Juanetta Gosling, M.D.  Electronically Signed     ELH/MEDQ  D:  10/27/2007  T:  10/27/2007  Job:  [id number]

## 2011-03-04 NOTE — Group Therapy Note (Signed)
  NAME:  [name], [name]              ACCOUNT NO.:  [id number]  MEDICAL RECORD NO.:  [id number]           PATIENT TYPE:  LOCATION:                                 FACILITY:  PHYSICIAN:  Mila Homer. Sudie Bailey, M.D.DATE OF BIRTH:  Nov 02, 1929  DATE OF PROCEDURE: DATE OF DISCHARGE:                                PROGRESS NOTE   SUBJECTIVE:  Generally she is doing much better although she has a lot of itching.  She is taking Benadryl for this.  She is being seen by Dr. Kristian Covey, the nephrologist.  She has had increasing BUN and creatinine and he has recommended that she have hemodialysis.  The repeat surgery on the left knee has been put off because of this.  OBJECTIVE:  Today she is sitting up in bed.  She is in no acute distress.  She is well developed and well nourished.  Her speech is normal.  Sensorium intact.  Her heart has a regular rhythm and rate of about 70.  Lungs are clear throughout.  There is no edema of the ankles.  Her recent BUN was 81 with a creatinine of 5.81.  She has also had hyperkalemia.  ASSESSMENT: 1. Infection of the left total knee, now status post removal of the     total knee hardware and awaiting repeat surgery with another total     knee to the left. 2. Chronic asthma. 3. Chronic renal insufficiency, question etiology. 4. Hyperkalemia. 5. Benign essential hypertension.  Dr. Kristian Covey has stopped her Cozaar.  She was on Cozaar 50 mg daily with atenolol 25 mg daily and hydrochlorothiazide 25 mg daily.  Hopefully that has helped her.  He has also put her on Kayexalate 30 g weekly for her hyperkalemia.  We are checking to see if she has had a renal ultrasound within the last six months.  We will arrange this if not done.  Continue the Benadryl for itching.     Mila Homer. Sudie Bailey, M.D.     SDK/MEDQ  D:  01/21/2011  T:  01/21/2011  Job:  [id number]  Electronically Signed by John Giovanni M.D. on 03/04/2011 06:55:18 AM

## 2011-03-04 NOTE — Group Therapy Note (Signed)
  NAME:  [name], [name]              ACCOUNT NO.:  [id number]  MEDICAL RECORD NO.:  [id number]           PATIENT TYPE:  LOCATION:                                 FACILITY:  PHYSICIAN:  Mila Homer. Sudie Bailey, M.D.DATE OF BIRTH:  06/25/1930  DATE OF PROCEDURE: DATE OF DISCHARGE:                                PROGRESS NOTE   SUBJECTIVE:  She is feeling about the same.  She is now going to dialysis 3 times a week.  OBJECTIVE:  She is sitting up in bed.  She looks happy.  She is no acute distress.  Well developed, well nourished. HEART:  Has a regular rhythm, rate of about 70. LUNGS:  Clear throughout.  She is moving air well.  Color is good and there is no edema of the ankles.  ASSESSMENT: 1. Chronic infection of the left knee, now awaiting a left total knee. 2. Chronic renal insufficiency, cause unknown.  PLAN:  I spent a lot of time today talking to her about her kidney disease.  We really do not know what has caused this.  I have discussed the case with Dr. Kristian Covey, nephrologist, who has advised me that even if we were to know the specific diagnosis by renal biopsy that would still not affect the patient in that she will need dialysis as treatment.  I discussed all this with the patient.  Hopefully her renal function will improve over time and she will be ready to have her left knee surgery redone and then will be able to be ambulating and return home at some time within the next 3 to 6 months.     Mila Homer. Sudie Bailey, M.D.     SDK/MEDQ  D:  02/25/2011  T:  02/25/2011  Job:  [id number]  Electronically Signed by John Giovanni M.D. on 03/04/2011 06:55:37 AM

## 2011-04-05 NOTE — Discharge Summary (Signed)
  NAME:  [name], SHVARTSMAN              ACCOUNT NO.:  [id number]  MEDICAL RECORD NO.:  [id number]           PATIENT TYPE:  LOCATION:                                 FACILITY:  PHYSICIAN:  Mila Homer. Sudie Bailey, M.D.DATE OF BIRTH:  02/14/1930  DATE OF ADMISSION: DATE OF DISCHARGE:  LH                              DISCHARGE SUMMARY   HOSPITAL COURSE:  This 77 year old woman was treated at the [facility] in [place], [place], initially for an infected left total knee. She had had the hardware from the total knee removed at [facility], and she was on antibiotics at the [facility] while the knee healed, preparatory to having a new knee put in place.  While she was the nursing home, it was noted that she had increasingly worse renal function.  Dr. Kristian Covey, nephrologist, was consulted about this.  Everything was discussed with the patient and eventually, she agreed to hemodialysis which she started while she was in the nursing home.  We never knew the exact cause of this.  Dr. Kristian Covey noted that we could have a renal biopsy and get a specific diagnosis, but it really would not change the treatment at this point.  The patient was stable on dialysis at the time of discharge, and there was no more sign of infection of the left knee.  She was discharged home with a wheelchair, 3-in-1 commode, hospital bed and mechanical lift.  She has 2 sons living in [place] who will be able to help her.  She will be on dialysis every Tuesday, Thursday and Saturday.  DISCHARGE MEDICATIONS: 1. Ditropan 5 mg daily. 2. Lufyllin 200 mg daily. 3. Celexa 40 mg daily. 4. Atenolol 25 mg daily. 5. Tylenol 500 mg 2 tablets b.i.d. 6. Advair 100/50 one puff b.i.d. 7. Benadryl 25 mg 1 q.8 hours for itching. 8. Albuterol inhaler 2 puffs q.4 hours for breathing. 9. Oxycodone 5 mg 1-2 tablets q.4 hours for pain.  At the time of     discharge, she used the oxycodone rarely, and a script was written  for oxycodone 5 mg to take 1-2 tablets q.4 hours (100 with no     refills).  FINAL DISCHARGE DIAGNOSES: 1. Infection of the left total knee, now status post removal of the     total knee hardware with a spacer placed. 2. Chronic renal insufficiency, unknown etiology. 3. Need for hemodialysis. 4. Asthma. 5. Long-term use of pain medication, much improved. 6. Benign essential hypertension. 7. Lumbago. 8. Anemia, multifactorial etiology, felt to be secondary to her     chronic renal insufficiency.  FOLLOWUP:  In my office within several weeks.     Mila Homer. Sudie Bailey, M.D.     SDK/MEDQ  D:  03/08/2011  T:  03/08/2011  Job:  [id number]  Electronically Signed by John Giovanni M.D. on 04/05/2011 07:08:44 AM

## 2011-05-04 ENCOUNTER — Inpatient Hospital Stay (HOSPITAL_COMMUNITY)
Admission: AD | Admit: 2011-05-04 | Discharge: 2011-05-10 | DRG: 592 | Disposition: A | Discharge status: Home Health | Payer: Medicare Other | Attending: Family Medicine | Admitting: Family Medicine

## 2011-05-04 DIAGNOSIS — M545 Low back pain, unspecified: Secondary | ICD-10-CM | POA: Diagnosis not present

## 2011-05-04 DIAGNOSIS — E871 Hypo-osmolality and hyponatremia: Secondary | ICD-10-CM | POA: Diagnosis not present

## 2011-05-04 DIAGNOSIS — R6251 Failure to thrive (child): Secondary | ICD-10-CM | POA: Diagnosis present

## 2011-05-04 DIAGNOSIS — N039 Chronic nephritic syndrome with unspecified morphologic changes: Secondary | ICD-10-CM | POA: Diagnosis present

## 2011-05-04 DIAGNOSIS — M1710 Unilateral primary osteoarthritis, unspecified knee: Secondary | ICD-10-CM | POA: Diagnosis present

## 2011-05-04 DIAGNOSIS — F329 Major depressive disorder, single episode, unspecified: Secondary | ICD-10-CM | POA: Diagnosis present

## 2011-05-04 DIAGNOSIS — IMO0002 Reserved for concepts with insufficient information to code with codable children: Secondary | ICD-10-CM | POA: Diagnosis present

## 2011-05-04 DIAGNOSIS — F341 Dysthymic disorder: Secondary | ICD-10-CM | POA: Diagnosis present

## 2011-05-04 DIAGNOSIS — L8991 Pressure ulcer of unspecified site, stage 1: Secondary | ICD-10-CM | POA: Diagnosis present

## 2011-05-04 DIAGNOSIS — E46 Unspecified protein-calorie malnutrition: Secondary | ICD-10-CM | POA: Diagnosis present

## 2011-05-04 DIAGNOSIS — L89309 Pressure ulcer of unspecified buttock, unspecified stage: Principal | ICD-10-CM | POA: Diagnosis present

## 2011-05-04 DIAGNOSIS — N189 Chronic kidney disease, unspecified: Secondary | ICD-10-CM | POA: Diagnosis present

## 2011-05-04 DIAGNOSIS — J45909 Unspecified asthma, uncomplicated: Secondary | ICD-10-CM | POA: Diagnosis not present

## 2011-05-04 DIAGNOSIS — I120 Hypertensive chronic kidney disease with stage 5 chronic kidney disease or end stage renal disease: Secondary | ICD-10-CM | POA: Diagnosis present

## 2011-05-04 DIAGNOSIS — Z992 Dependence on renal dialysis: Secondary | ICD-10-CM

## 2011-05-04 DIAGNOSIS — N186 End stage renal disease: Secondary | ICD-10-CM | POA: Diagnosis present

## 2011-05-04 DIAGNOSIS — D631 Anemia in chronic kidney disease: Secondary | ICD-10-CM | POA: Diagnosis present

## 2011-05-04 DIAGNOSIS — R627 Adult failure to thrive: Secondary | ICD-10-CM | POA: Diagnosis present

## 2011-05-04 DIAGNOSIS — M869 Osteomyelitis, unspecified: Secondary | ICD-10-CM

## 2011-05-04 DIAGNOSIS — I10 Essential (primary) hypertension: Secondary | ICD-10-CM | POA: Diagnosis present

## 2011-05-04 DIAGNOSIS — Z96659 Presence of unspecified artificial knee joint: Secondary | ICD-10-CM

## 2011-05-04 HISTORY — DX: Low back pain: M54.5

## 2011-05-04 HISTORY — DX: Depression, unspecified: F32.A

## 2011-05-04 HISTORY — DX: Disorder of kidney and ureter, unspecified: N28.9

## 2011-05-04 HISTORY — DX: Major depressive disorder, single episode, unspecified: F32.9

## 2011-05-04 HISTORY — DX: Low back pain, unspecified: M54.50

## 2011-05-04 LAB — COMPREHENSIVE METABOLIC PANEL
Albumin: 2.8 g/dL — ABNORMAL LOW (ref 3.5–5.2)
BUN: 42 mg/dL — ABNORMAL HIGH (ref 6–23)
CO2: 27 mEq/L (ref 19–32)
Calcium: 9.2 mg/dL (ref 8.4–10.5)
Chloride: 102 mEq/L (ref 96–112)
Creatinine, Ser: 3.12 mg/dL — ABNORMAL HIGH (ref 0.50–1.10)
GFR calc non Af Amer: 14 mL/min — ABNORMAL LOW (ref >60)
Total Bilirubin: 0.2 mg/dL — ABNORMAL LOW (ref 0.3–1.2)

## 2011-05-04 LAB — CBC
HCT: 38.8 % (ref 36.0–46.0)
Hemoglobin: 12.2 g/dL (ref 12.0–15.0)
MCHC: 31.4 g/dL (ref 30.0–36.0)
MCV: 90.7 fL (ref 78.0–100.0)

## 2011-05-04 LAB — TSH: TSH: 1.322 u[IU]/mL (ref 0.350–4.500)

## 2011-05-04 LAB — DIFFERENTIAL
Basophils Relative: 0 % (ref 0–1)
Eosinophils Relative: 5 % (ref 0–5)
Monocytes Absolute: 0.6 10*3/uL (ref 0.1–1.0)
Monocytes Relative: 6 % (ref 3–12)
Neutro Abs: 7 10*3/uL (ref 1.7–7.7)

## 2011-05-04 LAB — C-REACTIVE PROTEIN: CRP: 2.3 mg/dL — ABNORMAL HIGH (ref <0.6)

## 2011-05-04 LAB — SEDIMENTATION RATE: Sed Rate: 24 mm/hr — ABNORMAL HIGH (ref 0–22)

## 2011-05-04 MED ORDER — SODIUM CHLORIDE 0.9 % IV SOLN: 100.0000 mL | INTRAVENOUS | Status: DC | PRN

## 2011-05-04 MED ORDER — LOSARTAN POTASSIUM 50 MG PO TABS
100.0000 mg | ORAL_TABLET | Freq: Every day | ORAL | Status: DC
  Administered 2011-05-04 – 2011-05-09 (×5): 100 mg via ORAL
  Filled 2011-05-04 (×5): qty 2

## 2011-05-04 MED ORDER — LIDOCAINE HCL (PF) 1 % IJ SOLN: 5.0000 mL | INTRAMUSCULAR | Status: DC | PRN

## 2011-05-04 MED ORDER — CITALOPRAM HYDROBROMIDE 20 MG PO TABS
40.0000 mg | ORAL_TABLET | Freq: Every day | ORAL | Status: DC
  Administered 2011-05-04 – 2011-05-09 (×5): 40 mg via ORAL
  Filled 2011-05-04 (×5): qty 2

## 2011-05-04 MED ORDER — HEPARIN SODIUM (PORCINE) 1000 UNIT/ML IJ SOLN
2000.0000 [IU] | INTRAMUSCULAR | Status: DC
  Filled 2011-05-04 (×10): qty 2

## 2011-05-04 MED ORDER — HEPARIN SODIUM (PORCINE) 1000 UNIT/ML IJ SOLN
1000.0000 [IU] | Freq: Once | INTRAMUSCULAR | Status: DC
  Filled 2011-05-04: qty 1

## 2011-05-04 MED ORDER — OXYCODONE HCL 5 MG PO TABS
5.0000 mg | ORAL_TABLET | ORAL | Status: DC | PRN
  Administered 2011-05-07 – 2011-05-10 (×6): 5 mg via ORAL
  Filled 2011-05-04 (×6): qty 1

## 2011-05-04 MED ORDER — OXYBUTYNIN CHLORIDE 5 MG PO TABS
5.0000 mg | ORAL_TABLET | Freq: Every day | ORAL | Status: DC
  Administered 2011-05-04 – 2011-05-09 (×5): 5 mg via ORAL
  Filled 2011-05-04 (×5): qty 1

## 2011-05-04 MED ORDER — NEPRO/CARBSTEADY PO LIQD: 237.0000 mL | ORAL | Status: DC | PRN

## 2011-05-04 MED ORDER — PENTAFLUOROPROP-TETRAFLUOROETH EX AERO
1.0000 "application " | INHALATION_SPRAY | CUTANEOUS | Status: DC | PRN
  Filled 2011-05-04: qty 103.5

## 2011-05-04 MED ORDER — HEPARIN SODIUM (PORCINE) 1000 UNIT/ML IJ SOLN
2000.0000 [IU] | INTRAMUSCULAR | Status: DC
  Filled 2011-05-04 (×20): qty 2

## 2011-05-04 MED ORDER — THEOPHYLLINE 200 MG PO TB12: 200.0000 mg | ORAL_TABLET | Freq: Every day | ORAL | Status: DC

## 2011-05-04 MED ORDER — LIDOCAINE-PRILOCAINE 2.5-2.5 % EX CREA: 1.0000 "application " | TOPICAL_CREAM | CUTANEOUS | Status: DC | PRN

## 2011-05-04 MED ORDER — ATENOLOL 25 MG PO TABS
25.0000 mg | ORAL_TABLET | Freq: Every day | ORAL | Status: DC
  Administered 2011-05-04: 25 mg via ORAL
  Filled 2011-05-04: qty 1

## 2011-05-04 MED ORDER — HEPARIN SODIUM (PORCINE) 1000 UNIT/ML IJ SOLN
1000.0000 [IU] | Freq: Once | INTRAMUSCULAR | Status: AC
  Administered 2011-05-05: 4200 [IU]
  Filled 2011-05-04: qty 1

## 2011-05-04 NOTE — H&P (Signed)
[name], April              ACCOUNT NO.:  [id number]  MEDICAL RECORD NO.:  [id number]  LOCATION:  APA02                         FACILITY:  [facility]  PHYSICIAN:  Mila Homer. Sudie Bailey, M.D.DATE OF BIRTH:  December 09, 1929  DATE OF ADMISSION:  05/04/2011 DATE OF DISCHARGE:  LH                             HISTORY & PHYSICAL   This 77 year old woman has currently been home generally going downhill. She has had chronic renal failure for which she is on dialysis three times a week.  She has had two  left knee replacements over the last several years both of which have become infected.  She had been followed at [facility] for this.  She had a spacer put in place and was on IV and then n.p.o. antibiotics for months and was recently at the [facility] nursing facility for this and they felt that she got strong enough to be able to up and go to the bathroom when she got home,  so was discharged home in May of this year.  Apparently when she got home she stayed terribly weak.  She had been back in  her own home and her son has been trying to take care of her with help  but she had been unable to get up, she developed decubitus ulcers and she generally got worse.  She was due to have further workup of renal shunt at [facility] recently but her white cell count has stayed elevated so there was concern about that and they did not go ahead with the procedure.  She was also due to have her third total knee done once everything was taken care of but this is being unable to be done because of her other medical problems.  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  She has also had some chronic reactive depression which has set in with all this.  SOCIAL  HISTORY:  Her husband is deceased some time ago and she has two sons who live close by.  MEDICATIONS:  Most recently include Ditropan 5 mg daily, [place]aughlin 200 mg daily, Celexa 40 mg daily, atenolol 25 mg daily, Advair 100/50 puff b.i.d., albuterol inhaler two puffs  q.4 hours  for breathing, and oxycodone 5 mg for pain which was used rarely.  She has  also been on Benadryl for itching and Tylenol for pain or fever.  Her son has had nursing help to try to get her turned and moved but again he uses a [place] lift to try to get her to just get up into a chair since she cannot do this on her own and she developed decubitus ulcers in the process of lying in bed day after day.  She has been __________ able to get up to use a commode and has really required total care at home.  PHYSICAL EXAMINATION:  GENERAL APPEARANCE:  In the emergency room  she is a pleasant elderly, somewhat obese woman. VITAL SIGNS:  Temperature is 97.8, pulse 58,  respiratory rate 18, blood pressure 178/56.  O2 saturation  was 95%.  Color was good.  Her speech was normal.  Sentence structure intact.  Mucous membranes are moist. Skin turgor normal. CARDIAC:  Heart has a regular rhythm,  rate of about 70. LUNGS:  Clear throughout,  moving air well.  There is no axillary or supraclavicular adenopathy. ABDOMEN:  Her abdomen was soft without organomegaly or mass or tenderness. SKIN:  Careful inspection of the buttock showed on each buttock a grade 1 decubitus ulcer.  Each one was about an inch to an inch and a half in diameter.  The sacrum showed no ulcers. The perianal area seemed to be normal except for some hyperpigmentation. EXTREMITIES:  She had no edema of the ankles.  ADMISSION DIAGNOSES: 1. Failure to thrive. 2. Chronic renal insufficiency now on dialysis. 3. 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 4. Degenerative joint disease of the knees. 5. Benign essential hypertension. 6.  Asthma. 7. Lumbago. 8. Chronic use of narcotics. 9. Anemia associated with chronic renal failure. 10.Prolonged reactive depression.  PLAN:  She is admitted to the hospital for nursing care for the decubitus, repeat blood work, and nuclear medicine scan to establish whether she still has infection of the left knee area.  She may well have a chronic osteomyelitis. I discussed this with her son.  At any rate at this point with this number of problems, being unable to walk and requiring total care,  she will need a full medical workup and possible transfer to a nursing facility when this is done.     Mila Homer. Sudie Bailey, M.D.     SDK/MEDQ  D:  05/04/2011  T:  05/04/2011  Job:  [id number]

## 2011-05-04 NOTE — ED Notes (Signed)
Bed 318 assigned to pt. Room currently dirty. Waiting for room to be cleaned and for charge nurse Tammy RN to call when room cleaned.

## 2011-05-04 NOTE — ED Notes (Signed)
Dr. Sudie Bailey called pt and informed pt to call EMS to come to ER to be evaluated d/t recent blood work that showed elevated WBC. Dr. Sudie Bailey wants to be paged when pt arrived. Pt states she does not feel "sick" pt has had recent port-a-cath  Placement. nad noted upon arrival. A/o x3.

## 2011-05-04 NOTE — ED Notes (Signed)
Pt taken to 318 by Kennedy Bucker. nad noted prior to transport.

## 2011-05-04 NOTE — ED Notes (Signed)
Vital signs stable. 

## 2011-05-04 NOTE — ED Notes (Signed)
Patient is resting comfortably. 

## 2011-05-04 NOTE — ED Notes (Signed)
Patient denies pain and is resting comfortably.  

## 2011-05-04 NOTE — H&P (Signed)
[id number] 

## 2011-05-04 NOTE — ED Notes (Signed)
Dr. Sudie Bailey here and gave admission orders. Dr. Sudie Bailey was in and assessed pt. nad noted upon arrival. Pt a/o x3.  Family at bsd. Waiting for bed assignment. No now orders received.

## 2011-05-04 NOTE — ED Notes (Signed)
Peri care, changed patient bed linen and clothes

## 2011-05-04 NOTE — Consult Note (Signed)
Reason for Consult: End-stage renal disease Dr. Sudie Bailey Referring Physician: A. He  [name] is an 77 y.o. female.  HPI: [name] up since his 77 years old female was history of hypertension end-stage renal disease started on hemodialysis recently her back. Presently the patient is admitted to [facility] because of her failure to thrive. Discussed denies any nausea or vomiting patient to be improving. She denies also any shortness of rest. Her dialysis schedule is the unusually a Tuesday Thursday and Saturday her last dialysis was yesterday.  Past Medical History  Diagnosis Date  . Depression   . Hypertension   . Renal disorder   . Asthma   . Anemia   . Lumbago     Past Surgical History  Procedure Date  . Knee surgery   . Patellectomy   . Back surgery   . Abdominal hysterectomy   . Replacement total knee     No family history on file.  Social History:  reports that she has never smoked. She does not have any smokeless tobacco history on file. She reports that she does not drink alcohol or use illicit drugs.  Allergies: No Known Allergies  Medications:  Scheduled:   . tenormin  25 mg Oral Daily  . citalopram  40 mg Oral Daily  . losartan  100 mg Oral Daily  . oxybutynin  5 mg Oral Daily  . DISCONTD: theophylline  200 mg Oral Daily   Continuous:   Results for orders placed during the hospital encounter of 05/04/11 (from the past 48 hour(s))  CBC     Status: Normal   Collection Time   05/04/11 10:19 AM      Component Value Range Comment   WBC 9.2  4.0 - 10.5 (K/uL)    RBC 4.28  3.87 - 5.11 (MIL/uL)    Hemoglobin 12.2  12.0 - 15.0 (g/dL)    HCT 04.5  40.9 - 81.1 (%)    MCV 90.7  78.0 - 100.0 (fL)    MCH 28.5  26.0 - 34.0 (pg)    MCHC 31.4  30.0 - 36.0 (g/dL)    RDW 91.4  78.2 - 95.6 (%)    Platelets 209  150 - 400 (K/uL)   DIFFERENTIAL     Status: Normal   Collection Time   05/04/11 10:19 AM      Component Value Range Comment   Neutrophils Relative  77  43 - 77 (%)    Neutro Abs 7.0  1.7 - 7.7 (K/uL)    Lymphocytes Relative 12  12 - 46 (%)    Lymphs Abs 1.1  0.7 - 4.0 (K/uL)    Monocytes Relative 6  3 - 12 (%)    Monocytes Absolute 0.6  0.1 - 1.0 (K/uL)    Eosinophils Relative 5  0 - 5 (%)    Eosinophils Absolute 0.5  0.0 - 0.7 (K/uL)    Basophils Relative 0  0 - 1 (%)    Basophils Absolute 0.0  0.0 - 0.1 (K/uL)   COMPREHENSIVE METABOLIC PANEL     Status: Abnormal   Collection Time   05/04/11 10:19 AM      Component Value Range Comment   Sodium 137  135 - 145 (mEq/L)    Potassium 4.1  3.5 - 5.1 (mEq/L)    Chloride 102  96 - 112 (mEq/L)    CO2 27  19 - 32 (mEq/L)    Glucose, Bld 85  70 - 99 (mg/dL)  BUN 42 (*) 6 - 23 (mg/dL)    Creatinine, Ser 4.69 (*) 0.50 - 1.10 (mg/dL)    Calcium 9.2  8.4 - 10.5 (mg/dL)    Total Protein 6.9  6.0 - 8.3 (g/dL)    Albumin 2.8 (*) 3.5 - 5.2 (g/dL)    AST 13  0 - 37 (U/L)    ALT 8  0 - 35 (U/L)    Alkaline Phosphatase 90  39 - 117 (U/L)    Total Bilirubin 0.2 (*) 0.3 - 1.2 (mg/dL)    GFR calc non Af Amer 14 (*) >60 (mL/min)    GFR calc Af Amer 17 (*) >60 (mL/min)   SEDIMENTATION RATE     Status: Abnormal   Collection Time   05/04/11 10:19 AM      Component Value Range Comment   Sed Rate 24 (*) 0 - 22 (mm/hr)     No results found.  Review of Systems  Constitutional: Negative for chills and weight loss.  Cardiovascular: Negative for chest pain.  Gastrointestinal: Negative for nausea and vomiting.   Blood pressure 192/71, pulse 59, temperature 97.6 F (36.4 C), temperature source Oral, resp. rate 21, height 5\' 5"  (1.651 m), weight 81.647 kg (180 lb), SpO2 93.00%. Physical Exam  Cardiovascular: Normal rate, regular rhythm and normal heart sounds.   No murmur heard. Respiratory: No respiratory distress. She has no wheezes. She has no rales.  GI: There is no tenderness. There is no rebound and no guarding.  Musculoskeletal: She exhibits no edema.    Assessment/Plan: Problem #1  end-stage renal disease she is status post hemodialysis yesterday her BUN and creatinine at this moment seems to be a reasonable and she has a normal potassium Problem #2 anemia the patient is on the Epogen as etiology is thought to be secondary to chronic renal failure. Since her hemoglobin seems to be somewhat high for her Epogen seems to be received at this moment. Problem #3history of hypertension blood pressure seems to be controlled better well Problem #4 history of depression Problem #5 history of chronic back pain she is on pain medication. Problem #6 history of obesity Problem #7 history of her failure to thrive the patient recently started on hemodialysis presently she seems to be improving. And patient doesn't have any nausea or vomiting. Recommendation I will make arrangements for patient to get dialysis tomorrow                               We'll attempt to remove possibly about 2 L if her blood pressure tolerates.                              We'll check her phosphorus and  I will try to put her back on the binder.                               Since her hemoglobin is above 12 we'll hold her Epogen. Consistent of consult thank you  [facility] S 05/04/2011, 3:52 PM

## 2011-05-04 NOTE — ED Notes (Signed)
Report given to [facility] RN on 300.

## 2011-05-05 ENCOUNTER — Inpatient Hospital Stay (HOSPITAL_COMMUNITY): Payer: Medicare Other

## 2011-05-05 ENCOUNTER — Encounter (HOSPITAL_COMMUNITY): Payer: Self-pay

## 2011-05-05 LAB — HEPATITIS B SURFACE ANTIGEN: Hepatitis B Surface Ag: NEGATIVE

## 2011-05-05 MED ORDER — HEPARIN SODIUM (PORCINE) 1000 UNIT/ML IJ SOLN: 1000.0000 [IU] | Freq: Once | INTRAMUSCULAR | Status: DC

## 2011-05-05 MED ORDER — TECHNETIUM TC 99M MEDRONATE IV KIT
25.0000 | PACK | Freq: Once | INTRAVENOUS | Status: AC | PRN
  Administered 2011-05-05: 22 via INTRAVENOUS

## 2011-05-05 MED ORDER — SODIUM CHLORIDE 0.9 % IV SOLN: 100.0000 mL | INTRAVENOUS | Status: DC | PRN

## 2011-05-05 MED ORDER — LIDOCAINE-PRILOCAINE 2.5-2.5 % EX CREA: 1.0000 "application " | TOPICAL_CREAM | CUTANEOUS | Status: DC | PRN

## 2011-05-05 MED ORDER — LIDOCAINE HCL (PF) 1 % IJ SOLN: 5.0000 mL | INTRAMUSCULAR | Status: DC | PRN

## 2011-05-05 MED ORDER — HEPARIN SODIUM (PORCINE) 1000 UNIT/ML IJ SOLN
2000.0000 [IU] | INTRAMUSCULAR | Status: DC
  Administered 2011-05-05: 2000 [IU] via INTRAVENOUS

## 2011-05-05 MED ORDER — NEPRO/CARBSTEADY PO LIQD: 237.0000 mL | ORAL | Status: DC | PRN

## 2011-05-05 MED ORDER — PENTAFLUOROPROP-TETRAFLUOROETH EX AERO: 1.0000 "application " | INHALATION_SPRAY | CUTANEOUS | Status: DC | PRN

## 2011-05-05 MED ORDER — HEPARIN SODIUM (PORCINE) 1000 UNIT/ML IJ SOLN
500.0000 [IU] | INTRAMUSCULAR | Status: DC
  Administered 2011-05-05 (×3): 500 [IU] via INTRAVENOUS

## 2011-05-05 NOTE — Progress Notes (Signed)
[name], [name]              ACCOUNT NO.:  [id number]  MEDICAL RECORD NO.:  [id number]  LOCATION:  A318                          FACILITY:  [facility]  PHYSICIAN:  Mila Homer. Sudie Bailey, M.D.DATE OF BIRTH:  06/06/1930  DATE OF PROCEDURE: DATE OF DISCHARGE:                                PROGRESS NOTE   The patient is really feeling quite well.  OBJECTIVE:  Temperature is 98 degrees, pulse 55 respiratory rate 16, blood pressure 181/84 while undergoing dialysis.  Low blood pressures, recently been 144/68.  O2 sats 92%. Today she is in a semi recumbent bed while she is having dialysis in the hospital.  She is a little bit pale.  She is well-developed and obese in no acute distress, oriented, alert. LUNGS:  Show decreased breath sounds throughout but she is moving air well. HEART:  Has regular rhythm and rate about 60. ABDOMEN:  Soft.  Her admission white cell count is 9200 and hemoglobin 12.2.  Her BUN was 42 with a creatinine 3.12.  Her sedimentation rate was 24 and her C- reactive protein 2.3 (normal less than 0.6).  She was MRSA positive.  ?The x-ray of the left knee showed lucency at the junction of the femoral methacrylate, the femoral diaphysis which was felt to be either loosening or infection and also has minimal lucencies around the tibial component of the methacrylate again represent either loosening or infection.  However, there was no uptake in bone scan at this site, and therefore suggesting this is postsurgical rather than  inflammatory. She also had a Nuclear Medicine three-phase bone scan of the knees. First of all, she had impaired delivery of trace to the lower extremities which is compatible with either poor cardiac output or atherosclerotic disease with stenosis of the lower extremities, and she also had delayed uptake of trace to the left patella either related to surgery or infection.  Had also increased blood pooling and delayed uptake of trace at the  junction of the methacrylate component at the distal and left femoral prosthesis at the femoral diaphysis relating to infection or loosening of the methacrylate component.  ASSESSMENT: 1. Failure to thrive. 2. Decubitus ulcers of the buttocks. 3. Benign essential hypertension. 4. Anemia related to his chronic renal failure. 5. Prolonged depressive reaction 6. Chronic kidney failure. 7. Renal dialysis status. 8. Question osteomyelitis of the left femur. 9. Question decreased cardiac output versus atherosclerosis of the     lower extremities.  PLAN:  She continues on dialysis now.  Given that the white cell count is normal, and she is asymptomatic, and the radiological findings can be explained by just loosening of the appliance, I am going to have physical therapy see her and try to get her up and moving and arrange for her to go to a nursing home for 1-2 months to make sure she ambulates before she can considers going home. We will discuss with Radiology to see if there is another radiological technique to establish whether or not she does have osteomyelitis.     Mila Homer. Sudie Bailey, M.D.     SDK/MEDQ  D:  05/05/2011  T:  05/05/2011  Job:  [id number]

## 2011-05-05 NOTE — Progress Notes (Signed)
Subjective: Interval History: She denies any nausea vomiting her appetite is good she denies also any her shortness of rest.  Objective: Vital signs in last 24 hours: Temp:  [97.6 F (36.4 C)-98.1 F (36.7 C)] 97.8 F (36.6 C) (07/18 2240) Pulse Rate:  [55-59] 58  (07/18 2240) Resp:  [16-21] 16  (07/18 2240) BP: (144-192)/(56-75) 167/72 mmHg (07/18 2240) SpO2:  [92 %-96 %] 94 % (07/18 2240) Weight:  [81.647 kg (180 lb)-90.9 kg (200 lb 6.4 oz)] 200 lb 6.4 oz (90.9 kg) (07/18 1718) Weight change:   Intake/Output from previous day: 07/18 0701 - 07/19 0700 In: 480 [P.O.:480] Out: -  Intake/Output this shift:    Resp: clear to auscultation bilaterally are generally she's alert in no apparent distress Chest is clear to auscultation no rales no rhonchi no egophony Heart exam revealed regular rate and rhythm no murmur no history Abdomen soft positive bowel sound Extremities she has trace edema.  Lab Results:  Basename 05/04/11 1019  WBC 9.2  HGB 12.2  HCT 38.8  PLT 209   BMET:  Basename 05/04/11 1019  NA 137  K 4.1  CL 102  CO2 27  GLUCOSE 85  BUN 42*  CREATININE 3.12*  CALCIUM 9.2   No results found for this basename: PTH:2 in the last 72 hours Iron Studies: No results found for this basename: IRON,TIBC,TRANSFERRIN,FERRITIN in the last 72 hours  Studies/Results: No results found.  I have reviewed the patient's current medications.  Assessment/Plan: Problem #1 end-stage Thad Ranger is a presently the patient denies any nausea or vomiting. Her last dialysis was the day before yesterday. Presently she is due for dialysis today. Again this was an acceptable range and she is in normal potassium. Hypertension patient her blood pressure seems to be somewhat high. The patient is on lab does have to 100 mg by mouth daily and also a Toprol 50 mg by mouth once a day. And a presently because of her dialysis patient didn't get any medication. Problem #3 history of anemia is  secondary to her chronic renal failure the patient on Epogen as an outpatient however her her hemoglobin hematocrit seems to be a somewhat high. Next History of decubitus ulcer I will make her in preparation to get dialysis today. And will follow her blood work. And this is end of progress note thank you    LOS: 1 day   Ishanvi Mcquitty S 05/05/2011,7:04 AM

## 2011-05-05 NOTE — Progress Notes (Signed)
[id number] 

## 2011-05-06 MED ORDER — SODIUM CHLORIDE 0.9 % IV SOLN: 100.0000 mL | INTRAVENOUS | Status: DC | PRN

## 2011-05-06 MED ORDER — MUPIROCIN 2 % EX OINT
1.0000 "application " | TOPICAL_OINTMENT | Freq: Two times a day (BID) | CUTANEOUS | Status: DC
  Administered 2011-05-06 – 2011-05-10 (×9): 1 via NASAL
  Filled 2011-05-06 (×2): qty 22

## 2011-05-06 MED ORDER — AMLODIPINE BESYLATE 5 MG PO TABS
10.0000 mg | ORAL_TABLET | Freq: Every day | ORAL | Status: DC
  Administered 2011-05-08 – 2011-05-10 (×2): 10 mg via ORAL
  Filled 2011-05-06 (×3): qty 2

## 2011-05-06 MED ORDER — LIDOCAINE HCL (PF) 1 % IJ SOLN: 5.0000 mL | INTRAMUSCULAR | Status: DC | PRN

## 2011-05-06 MED ORDER — MUPIROCIN 2 % EX OINT: 1.0000 "application " | TOPICAL_OINTMENT | Freq: Two times a day (BID) | CUTANEOUS | Status: DC

## 2011-05-06 MED ORDER — HEPARIN SODIUM (PORCINE) 1000 UNIT/ML IJ SOLN: 1000.0000 [IU] | Freq: Once | INTRAMUSCULAR | Status: DC

## 2011-05-06 MED ORDER — PENTAFLUOROPROP-TETRAFLUOROETH EX AERO
1.0000 "application " | INHALATION_SPRAY | CUTANEOUS | Status: DC | PRN
  Filled 2011-05-06: qty 103.5

## 2011-05-06 MED ORDER — LIDOCAINE-PRILOCAINE 2.5-2.5 % EX CREA: 1.0000 "application " | TOPICAL_CREAM | CUTANEOUS | Status: DC | PRN

## 2011-05-06 MED ORDER — AMLODIPINE BESYLATE 5 MG PO TABS
5.0000 mg | ORAL_TABLET | Freq: Every day | ORAL | Status: DC
  Administered 2011-05-06: 5 mg via ORAL
  Filled 2011-05-06: qty 1

## 2011-05-06 MED ORDER — HEPARIN SODIUM (PORCINE) 1000 UNIT/ML IJ SOLN
1000.0000 [IU] | Freq: Once | INTRAMUSCULAR | Status: AC
  Administered 2011-05-07 (×2): 1000 [IU]
  Filled 2011-05-06: qty 1

## 2011-05-06 MED ORDER — NEPRO/CARBSTEADY PO LIQD: 237.0000 mL | ORAL | Status: DC | PRN

## 2011-05-06 MED ORDER — MUPIROCIN CALCIUM 2 % NA OINT: TOPICAL_OINTMENT | Freq: Two times a day (BID) | NASAL | Status: DC

## 2011-05-06 MED ORDER — HEPARIN SODIUM (PORCINE) 1000 UNIT/ML IJ SOLN
500.0000 [IU] | INTRAMUSCULAR | Status: DC
  Filled 2011-05-06 (×124): qty 0.5

## 2011-05-06 NOTE — Progress Notes (Signed)
Physical Therapy Evaluation Patient Name: [name] Date: 05/06/2011 Problem List:  Patient Active Problem List  Diagnoses  . Failure to thrive  . Renal dialysis status  . Chronic kidney failure  . Decubitus ulcer, stage I  . Decubitus ulcer, buttock  . Essential hypertension, benign  . Asthma  . Lumbago syndrome  . Osteoarthritis of knee  . Anemia associated with chronic renal failure  . Prolonged depressive reaction   Past Medical History:  Past Medical History  Diagnosis Date  . Depression   . Hypertension   . Renal disorder   . Asthma   . Anemia   . Lumbago   . Renal insufficiency    Past Surgical History:  Past Surgical History  Procedure Date  . Knee surgery   . Patellectomy   . Abdominal hysterectomy   . 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) Prior Functioning  Home Living Type of Home: House Lives With: Sheran Spine Help From: Family Home Layout: One level Home Access: Ramped entrance Home Adaptive Equipment: Walker - rolling;Wheelchair - manual Prior Function Level of Independence: Needs assistance with ADLs;Needs assistance with tranfers Driving: No Vocation: Retired Producer, television/film/video: Awake/alert Overall Cognitive Status: Appears within functional limits for tasks assessed Orientation Level: Oriented X4 Sensation/Coordination Sensation Light Touch: Appears Intact Proprioception: Appears Intact Extremity Assessment RUE Assessment RUE Assessment: Within Functional Limits LUE Assessment LUE Assessment: Exceptions to WFL LUE AROM (degrees) LUE Overall AROM Comments: minimal PROM in shoulder due  to rotator cuff tear-pianful at times, also decreasedoverall strength RLE Assessment RLE Assessment: Exceptions to [facility] RLE PROM (degrees) Right Ankle Dorsiflexion 0-20: -15  LLE Assessment LLE Assessment: Exceptions to WFL LLE AROM (degrees) LLE Overall AROM Comments: L knee flexion is 25 deg, extension is -15 deg Mobility (including Balance) Bed Mobility Bed Mobility: Yes Rolling Right: 7: Independent;With rail Rolling Left: 7: Independent;With rail Left Sidelying to Sit: 4: Min assist Left Sidelying to Sit Details (indicate cue type and reason): unable to use LUE to assist due to torn rotator cuff Supine to Sit: 4: Min assist Sitting - Scoot to Edge of Bed: 6: Modified independent (Device/Increase time) Transfers Transfers: Yes Sit to Stand: 1: +1 Total assist;From elevated surface;From bed Sit to Stand Details (indicate cue type and reason): unable to get to full stance Stand to Sit: 1: +1 Total assist Ambulation/Gait Ambulation/Gait: No (has not been ambulatory in quite some time) Stairs: No Wheelchair Mobility Wheelchair Mobility: No (pt has a manual w/c at home but has difficulty propellinf it)  Balance Balance Assessed: Yes Static Sitting Balance Static Sitting - Balance Support: No upper extremity supported;Feet supported Static Sitting - Level of Assistance: 7: Independent Dynamic Sitting Balance Dynamic Sitting - Balance Support: No upper extremity supported;Feet supported Dynamic Sitting - Level of Assistance: 7: Independent Exercise     End of Session PT - End of Session Equipment Utilized During Treatment: Gait belt Activity Tolerance: Patient tolerated treatment well Patient left: in bed PT Assessment/Plan/Recommendation PT Assessment Clinical Impression Statement: Pt with massive medical problems but motovated to improve mobility (barriers are NWB LLE, HD, min use of RUE) PT Recommendation/Assessment: Patient will need skilled PT in the acute care  venue PT Problem List:  Decreased strength;Decreased mobility;Obesity Barriers to Discharge: Decreased caregiver support Barriers to Discharge Comments: also on hemodialysis TIW PT Therapy Diagnosis : Generalized weakness (inability to transfer bed to chair) PT Plan PT Treatment/Interventions: Functional mobility training;Therapeutic exercise PT Recommendation Recommendations for Other Services: OT consult Follow Up Recommendations: Skilled nursing facility Equipment Recommended: Defer to next venue PT Goals  Acute Rehab PT Goals PT Goal Formulation: With patient Time For Goal Achievement: 2 weeks Pt will Stand: with +1 total assist [name] 05/06/2011, 10:45 AM

## 2011-05-06 NOTE — Progress Notes (Signed)
Subjective: Interval History: has no complaint of shortness of breath or dyspnea no paroxysmal nocturnal dyspnea. Patient also denies any fever chills or sweating.  Objective: Vital signs in last 24 hours: Temp:  [97.6 F (36.4 C)-98.1 F (36.7 C)] 98.1 F (36.7 C) (07/20 1000) Pulse Rate:  [53-62] 62  (07/20 1000) Resp:  [16-22] 21  (07/20 1000) BP: (130-209)/(71-110) 194/77 mmHg (07/20 1000) SpO2:  [92 %-98 %] 93 % (07/20 1000) Weight:  [88.6 kg (195 lb 5.2 oz)] 195 lb 5.2 oz (88.6 kg) (07/19 1722) Weight change: 6.953 kg (15 lb 5.2 oz)  Intake/Output from previous day: 07/19 0701 - 07/20 0700 In: 240 [P.O.:240] Out: -2500  Intake/Output this shift: I/O this shift: In: 120 [P.O.:120] Out: -  . patient alert in no apparent distress. Chest is clear to auscultation decreased breath sound bilaterally no rales or rhonchi or egophony. Heart exam regular rate and rhythm no murmur S3. Abdomen obese positive bowel sound nontender. Extremities she does have any edema.  Lab Results:  Basename 05/04/11 1019  WBC 9.2  HGB 12.2  HCT 38.8  PLT 209   BMET:  Basename 05/04/11 1019  NA 137  K 4.1  CL 102  CO2 27  GLUCOSE 85  BUN 42*  CREATININE 3.12*  CALCIUM 9.2   No results found for this basename: PTH:2 in the last 72 hours Iron Studies: No results found for this basename: IRON,TIBC,TRANSFERRIN,FERRITIN in the last 72 hours  Studies/Results: No results found.  Continuous:   . heparin 2,000 Units (05/05/11 1330)  . heparin 500 Units (05/05/11 1630)    Assessment/Plan: Problem #1 end-stage renal disease she is status post hemodialysis and that she doesn't have any uremic sinus symptoms. He patient doesn't have also is have fluid overload. Problem #2 history of hypertension presently her blood pressure seems to be still high. Problem #3 anemia H&H is stable Problem #4 CHF status post ultrafiltration of 2 L yesterday she doesn't have any sign of fluid  overload. Problem #5 obesity Problem #6 history of failure to thrive. Recommendation we'll add Norvasc 5 mg by mouth daily We'll continue his other medications and we'll make arrangements for patient to get dialysis tomorrow. This is end of the followup thank you    LOS: 2 days   [facility] S 05/06/2011,12:05 PM

## 2011-05-06 NOTE — Progress Notes (Signed)
[name], MOUNTZ              ACCOUNT NO.:  [id number]  MEDICAL RECORD NO.:  [id number]  LOCATION:  A318                          FACILITY:  [facility]  PHYSICIAN:  Mila Homer. Sudie Bailey, M.D.DATE OF BIRTH:  [birth year]  DATE OF PROCEDURE: DATE OF DISCHARGE:                                PROGRESS NOTE   SUBJECTIVE:  She generally feeling well today.  OBJECTIVE:  Temperature is 97.6, pulse 57, respiratory rate  22, blood pressure 130/71.  She is sitting up in bed, having eaten all her breakfast. She is in no acute distress.  Well-developed, well-nourished. Her lungs are clear throughout.  Her heart is regular rhythm,  rate about 70.  ABDOMEN:  Soft.  ASSESSMENT: 1. Failure thrive. Improved. 2. Decubitus ulcers of the buttocks. 3. Anemia secondary to chronic renal failure. 4. Chronic renal failure. 5. Prolonged depressive reaction. 6. Question osteomyelitis of the left femur.  PLAN:  I discussed her case at length with Dr. Ulyses Southward,  radiologist, yesterday who reviewed her bone scan and regular x-rays. Given these x-ray findings in addition to her fairly low sed rate and her normal white cell count, it is felt that she probably does not have osteomyelitis.  Right now the major problem is getting her up and moving.  I have strongly recommended that we get her into nursing home for short-term placement given the above but she tells me there may be financial reasons not to do so.     Mila Homer. Sudie Bailey, M.D.     SDK/MEDQ  D:  05/06/2011  T:  05/06/2011  Job:  [id number]

## 2011-05-06 NOTE — Progress Notes (Signed)
[id number] 

## 2011-05-07 ENCOUNTER — Inpatient Hospital Stay (HOSPITAL_COMMUNITY): Payer: Medicare Other

## 2011-05-07 LAB — CBC
MCV: 90.9 fL (ref 78.0–100.0)
Platelets: 243 10*3/uL (ref 150–400)
RBC: 4.63 MIL/uL (ref 3.87–5.11)
RDW: 14.1 % (ref 11.5–15.5)
WBC: 11.5 10*3/uL — ABNORMAL HIGH (ref 4.0–10.5)

## 2011-05-07 LAB — BASIC METABOLIC PANEL
CO2: 27 mEq/L (ref 19–32)
Chloride: 96 mEq/L (ref 96–112)
GFR calc Af Amer: 11 mL/min — ABNORMAL LOW (ref >60)
Potassium: 4 mEq/L (ref 3.5–5.1)
Sodium: 133 mEq/L — ABNORMAL LOW (ref 135–145)

## 2011-05-07 NOTE — Progress Notes (Signed)
[id number] 

## 2011-05-07 NOTE — Progress Notes (Signed)
Pt returned from dialysis incontinent with stool and urine. Pts bottom was red, bleeding, and oozing; Pt had urinated frequently and stooled all over herself and in bed. Pt abdominal areas were red and open as well as vaginal area. Cream was mixed and applied. Pt turned and repositioned to stay off area. Will continue to reapply cream and turn patient every 2 hours.

## 2011-05-07 NOTE — Progress Notes (Signed)
Subjective: Interval History: has no complaint of shortness of breath orthopnea or paroxysmal nocturnal dyspnea. Over all patient feels good she doesn't have any nausea or vomiting appetite is good..  Objective: Vital signs in last 24 hours: Temp:  [97.6 F (36.4 C)-98 F (36.7 C)] 97.9 F (36.6 C) (07/21 0948) Pulse Rate:  [56-70] 70  (07/21 1300) Resp:  [16-20] 17  (07/21 1300) BP: (90-156)/(44-81) 91/49 mmHg (07/21 1300) SpO2:  [92 %-96 %] 96 % (07/21 0948) Weight:  [91.3 kg (201 lb 4.5 oz)-91.8 kg (202 lb 6.1 oz)] 201 lb 4.5 oz (91.3 kg) (07/21 0948) Weight change: 3.2 kg (7 lb 0.9 oz)  Intake/Output from previous day: 07/20 0701 - 07/21 0700 In: 600 [P.O.:600] Out: -  Intake/Output this shift:    General appearance: alert, no distress and mildly obese Resp: diminished breath sounds bibasilar Chest wall: no tenderness Extremities: no edema, redness or tenderness in the calves or thighs  Lab Results:  Basename 05/07/11 0459  WBC 11.5*  HGB 13.2  HCT 42.1  PLT 243   BMET:  Basename 05/07/11 0459  NA 133*  K 4.0  CL 96  CO2 27  GLUCOSE 94  BUN 44*  CREATININE 4.79*  CALCIUM 9.5   No results found for this basename: PTH:2 in the last 72 hours Iron Studies: No results found for this basename: IRON,TIBC,TRANSFERRIN,FERRITIN in the last 72 hours  Studies/Results: No results found.  I have reviewed the patient's current medications. Problem #1 end-stage renal disease patient and presently on hemodialysis for her she is a symptomatic for potassium is 4 BUN and creatinine is phosphorous 4.79. Problem #2 history of hypertension  she will controlled very well Problem #3 history of CHF patient doesn't have any sign of fluid overload. We are able to ultrafiltrate out in one and half icterus today. Problem #4 anemia she is on Epogen H&H is stable. Problem #5 obesity Problem #6 poor nutrition. Problem #7 hyponatremia mild. Recommendation I would continue his  hemodialysis will try to get a 2l. depending on her blood pressure. Problem #3 history of anemia her H&H is stable 5. Assessment/Plan:     LOS: 3 days   Daequan Kozma S 05/07/2011,1:23 PM

## 2011-05-08 MED ORDER — OXYCODONE HCL 5 MG PO TABS
5.0000 mg | ORAL_TABLET | Freq: Once | ORAL | Status: AC
  Administered 2011-05-08: 5 mg via ORAL
  Filled 2011-05-08: qty 1

## 2011-05-08 NOTE — Progress Notes (Signed)
[id number] 

## 2011-05-08 NOTE — Progress Notes (Signed)
Subjective   she is a patient is end stage renal disease. Her she denies any shortness ofbreathor paroxysmal nocturnal dyspnea.  Objective: Vital signs in last 24 hours: Temp:  [98.2 F (36.8 C)-98.6 F (37 C)] 98.2 F (36.8 C) (07/22 1053) Pulse Rate:  [58-70] 63  (07/22 1053) Resp:  [17-22] 18  (07/22 1053) BP: (83-118)/(48-73) 112/73 mmHg (07/22 1053) SpO2:  [93 %-95 %] 93 % (07/22 1053) Weight:  [86.138 kg (189 lb 14.4 oz)] 189 lb 14.4 oz (86.138 kg) (07/22 0520) Weight change: -0.5 kg (-1 lb 1.6 oz)r alert no apparent distress.  apparent distress. Chest is clear to auscultation she does have any rales rhonchi. Heart exam revealed regular rate and rhythm no murmur no s3 Abdomen soft positive bowel sound Extremities she does have any edema. Intake/Output from previous day: 07/21 0701 - 07/22 0700 In: 190 [P.O.:190] Out: -2500  Intake/Output this shift: I/O this shift: In: 240 [P.O.:240] Out: -     Lab Results:  Basename 05/07/11 0459  WBC 11.5*  HGB 13.2  HCT 42.1  PLT 243   BMET:  Basename 05/07/11 0459  NA 133*  K 4.0  CL 96  CO2 27  GLUCOSE 94  BUN 44*  CREATININE 4.79*  CALCIUM 9.5   No results found for this basename: PTH:2 in the last 72 hours Iron Studies: No results found for this basename: IRON,TIBC,TRANSFERRIN,FERRITIN in the last 72 hours  Studies/Results: No results found.  I have reviewed the patient's current medications.  Assessment/Plan:  Problem #1 end-stage renal disease she is status post hemodialysis BUN is 44 creatinine is 4.79 with potassium of 4. Problem #2 history of hypertension her blood pressure since been controlled very well Problem #3 history of poor nutrition still po  intake is not adequate. Problem #4 anemia this is secondary to chronic renal failure H&H is stable. Her hemoglobin is 13..2 and hematocrit of 42.1. Problem #5 decubitus ulcer Problem #6 history of obesity Problem #7 history of failure to  thrive. Recommendation I would continue his present management I will do her dialysis on Tuesday which is her regular schedule.    LOS: 4 days   Gwenlyn Hottinger S 05/08/2011,12:02 PM   2

## 2011-05-09 MED ORDER — LIDOCAINE-PRILOCAINE 2.5-2.5 % EX CREA: 1.0000 "application " | TOPICAL_CREAM | CUTANEOUS | Status: DC | PRN

## 2011-05-09 MED ORDER — HEPARIN SODIUM (PORCINE) 1000 UNIT/ML IJ SOLN
2000.0000 [IU] | Freq: Once | INTRAMUSCULAR | Status: AC
  Administered 2011-05-10: 500 [IU] via INTRAVENOUS
  Administered 2011-05-10: 2000 [IU] via INTRAVENOUS

## 2011-05-09 MED ORDER — SODIUM CHLORIDE 0.9 % IV SOLN: 100.0000 mL | INTRAVENOUS | Status: DC | PRN

## 2011-05-09 MED ORDER — HEPARIN SODIUM (PORCINE) 1000 UNIT/ML IJ SOLN: 1000.0000 [IU] | Freq: Once | INTRAMUSCULAR | Status: DC

## 2011-05-09 MED ORDER — PENTAFLUOROPROP-TETRAFLUOROETH EX AERO: 1.0000 "application " | INHALATION_SPRAY | CUTANEOUS | Status: DC | PRN

## 2011-05-09 MED ORDER — LIDOCAINE HCL (PF) 1 % IJ SOLN: 5.0000 mL | INTRAMUSCULAR | Status: DC | PRN

## 2011-05-09 MED ORDER — NEPRO/CARBSTEADY PO LIQD: 237.0000 mL | ORAL | Status: DC | PRN

## 2011-05-09 MED ORDER — PRO-STAT SUGAR FREE PO LIQD
30.0000 mL | Freq: Two times a day (BID) | ORAL | Status: DC
  Administered 2011-05-09: 30 mL via ORAL
  Filled 2011-05-09 (×4): qty 30

## 2011-05-09 MED ORDER — HEPARIN SODIUM (PORCINE) 1000 UNIT/ML IJ SOLN: 2000.0000 [IU] | Freq: Once | INTRAMUSCULAR | Status: DC

## 2011-05-09 MED ORDER — HEPARIN SODIUM (PORCINE) 1000 UNIT/ML IJ SOLN
1000.0000 [IU] | Freq: Once | INTRAMUSCULAR | Status: DC
  Filled 2011-05-09: qty 1

## 2011-05-09 MED ORDER — PROSOURCE NO CARB PO LIQD: 30.0000 mL | Freq: Two times a day (BID) | ORAL | Status: DC

## 2011-05-09 NOTE — Consult Note (Signed)
CSW met with pt at bedside to continue discussion on d/c plan.  Pt reports she plans to go home and has discussed this with Dr. Sudie Bailey.  Pt states she has resources for sitters at home.  Pt also told CSW not to call her children as she feels they are trying to tell her what to do.  Pt is agreeable to home health and CM notified.  CSW to sign off.   Karn Cassis

## 2011-05-09 NOTE — Progress Notes (Signed)
[name], [name]              ACCOUNT NO.:  [id number]  MEDICAL RECORD NO.:  [id number]  LOCATION:  A318                          FACILITY:  [facility]  PHYSICIAN:  Mila Homer. Sudie Bailey, M.D.DATE OF BIRTH:  03/27/1930  DATE OF PROCEDURE: DATE OF DISCHARGE:                                PROGRESS NOTE   SUBJECTIVE:  Feels about the same today.  OBJECTIVE:  Temperature is 97.7, pulse 60,  respiratory rate  20, blood pressure 93/54.  O2 saturations  93%.  She is supine in bed.  Well- developed and obese.  She is oriented and alert.  She is breathing well. Color is slightly pale.  Her last hemoglobin was 13.2 and BUN 44 with the creatinine 4.79 two days ago.  ASSESSMENT: 1. Failure to thrive. 2. Chronic renal insufficiency on dialysis. 3. Status post infections of her left total knee x2.  PLAN:  Talked with the patient at length and also talked to Discharge Planning.  Arrangements will be made today either to have somebody come out to the house and help the patient or to have the patient go to a skilled nursing facility.  Discharge to one of these options tomorrow.     Mila Homer. Sudie Bailey, M.D.     SDK/MEDQ  D:  05/09/2011  T:  05/09/2011  Job:  [id number]

## 2011-05-09 NOTE — Progress Notes (Signed)
NAMESHERETA, CROTHERS              ACCOUNT NO.:  [id number]  MEDICAL RECORD NO.:  [id number]  LOCATION:                                 FACILITY:  PHYSICIAN:  Mila Homer. Sudie Bailey, M.D.DATE OF BIRTH:  1929-11-25  DATE OF PROCEDURE: DATE OF DISCHARGE:                                PROGRESS NOTE   SUBJECTIVE:  She is generally feeling about the same.  Dictation ended at this point.     Mila Homer. Sudie Bailey, M.D.     SDK/MEDQ  D:  05/09/2011  T:  05/09/2011  Job:  [id number]

## 2011-05-09 NOTE — Progress Notes (Signed)
Subjective: . she has a history of end-stage disease on maintenance hemodialysis Tuesday Thursday and Saturday at present she was brought in because of a cellulitis in that diarrhea. She denies any nausea no vomiting.   Vital signs in last 24 hours: Temp:  [97.7 F (36.5 C)-98.2 F (36.8 C)] 98.1 F (36.7 C) (07/23 0953) Pulse Rate:  [60-70] 70  (07/23 0953) Resp:  [18-20] 18  (07/23 0953) BP: (87-110)/(40-56) 94/56 mmHg (07/23 1007) SpO2:  [90 %-93 %] 93 % (07/23 1007) Weight change:   Intake/Output from previous day: 07/22 0701 - 07/23 0700 In: 480 [P.O.:480] Out: 0  Intake/Output this shift:    Gen. patient is alert and she doesn't seem to be any apparent distress.  chest is clear to auscultation and  no  rhonchi i or egophony. Exam revealed positive bowel sounds and nontender.  Abdomen soft positive bowel sounds Extremities she doesn't have any edema.  Lab Results:  [facility] 05/07/11 0459  WBC 11.5*  HGB 13.2  HCT 42.1  PLT 243   BMET:  Basename 05/07/11 0459  NA 133*  K 4.0  CL 96  CO2 27  GLUCOSE 94  BUN 44*  CREATININE 4.79*  CALCIUM 9.5   No results found for this basename: PTH:2 in the last 72 hours Iron Studies: No results found for this basename: IRON,TIBC,TRANSFERRIN,FERRITIN in the last 72 hours  Studies/Results: No results found.  I have reviewed the patient's current medications.  Assessment/Plan:  problem #1 and disadvantages she is status post hemodialysis on Saturday she is due for dialysis to tomorrow. From a medical history of anemia she is on Epogen her H&H is stable    problem #history of hypertension appears to be controlled better Problem # failure to thrive  Problem #6 history of asthma Problem#7 decubitus ulcer [employer] 7 history of diarrhea which has improved. Recommendation we'll make arrangements for patient to get dialysis tomorrow. We'll continue his other treatment.     Romar Woodrick S 05/09/2011,11:06  AM

## 2011-05-09 NOTE — Progress Notes (Signed)
INITIAL ADULT NUTRITION ASSESSMENT Date: 05/09/2011   Time: 12:17 PM Reason for Assessment: Wt loss  ASSESSMENT: Female 77 y.o.  Dx: Failure to thrive  Hx: depression, HTN, renal disorder, asthma, anemia, lumbago, renal insufficiency Related Meds: citalopram, heparin, losartan, oxybutynin, oxycodone  Ht: 5\' 5"  (165.1 cm)  Wt: 189 lb 14.4 oz (86.138 kg)  Ideal Wt: 57 kg  % Ideal Wt: 151%  Usual Wt: unable to obtain % Usual Wt: unable to obtain  Body mass index is 31.60 kg/(m^2).  Food/Nutrition Related Hx: Pt reports hx of weight loss, but unable to quantify how much. Reports she was 180# a few weeks ago, but wt constantly changes due to dialysis. Reports vary appetite. Pt with stage 1 decubitus ulcer on buttocks. Pt recently started dialysis. Noted orders for Nepro PRN.  Labs: Na: 133, K: 4.0, Cl: 96, CO2: 27, BUN: 44, Creat: 4.79, Glucose: 94  Intake: 190 Output: -2500  Diet Order: Renal  Supplements/Tube Feeding:  IVF:    DISCONTD: heparin    Estimated Nutritional Needs:   Kcal:1558-1699 kcals/day Protein:103-129 grams protein/day Fluid:1L fluid daily  NUTRITION DIAGNOSIS: -Predicted suboptimal energy intake (NI-1.6).  Status: Ongoing  RELATED TO: changes in appetite (varying)  AS EVIDENCE BY: pt with hx of weight loss.  MONITORING/EVALUATION(Goals): 1) Pt will meet greater than or equal to 75% of estimated energy needs 2) Pt will maintain current weight of 189#  EDUCATION NEEDS: -Education needs addressed  INTERVENTION: Continue Nepro PRN. Also add Prostat BID. Will continue to follow.   Dietitian [phone] (pager)  DOCUMENTATION CODES Per approved criteria  -Obesity Unspecified    Orlene Plum 05/09/2011, 12:17 PM

## 2011-05-09 NOTE — Progress Notes (Signed)
NAMENEVILLE, PAULS NO.:  [id number]  MEDICAL RECORD NO.:  [id number]  LOCATION:                                 FACILITY:  PHYSICIAN:  Melvyn Novas, MDDATE OF BIRTH:  1930/05/23  DATE OF PROCEDURE: DATE OF DISCHARGE:                                PROGRESS NOTE   The patient is a 77 year old white female who has chronic renal failure, stage renal disease dialysis, stage 1 decubitus ulcer, hypertension well controlled, anemia secondary to renal failure, depression, some malnutrition, failure to thrive. Temperature 98.2 today, blood pressure 112/73, respiratory rate is 18, pulse is 63.  New labs from yesterday revealed white count of 11.5, hemoglobin 13.2, sodium 133, BUN 44, creatinine 4.79, O2 sats 93%.  The patient is alert and oriented.  No dyspnea or chest pain.  No orthopnea.  Lungs are clear to A and P.  No rales, wheezes, or rhonchi.  Heart is regular rhythm.  No murmurs, gallops, heaves, thrills, or rubs.  Abdomen is soft, nontender, and nondetectable organomegaly.  The plan right now is to continue Norvasc 10, Celexa 40, Cozaar 100, and Ditropan.  Dialysis will be Tuesday.  They will perform a BMET at that time.     Melvyn Novas, MD     RMD/MEDQ  D:  05/08/2011  T:  05/09/2011  Job:  [id number]

## 2011-05-09 NOTE — Progress Notes (Signed)
Physical Therapy Treatment Patient Name: [name] AVWUJ'W Date: 05/09/2011 [id number] Problem List:  Patient Active Problem List  Diagnoses  . Failure to thrive  . Renal dialysis status  . Chronic kidney failure  . Decubitus ulcer, stage I  . Decubitus ulcer, buttock  . Essential hypertension, benign  . Asthma  . Lumbago syndrome  . Osteoarthritis of knee  . Anemia associated with chronic renal failure  . Prolonged depressive reaction  . 'light-for-dates' infant with signs of fetal malnutrition   Past Medical History:  Past Medical History  Diagnosis Date  . Depression   . Hypertension   . Renal disorder   . Asthma   . Anemia   . Lumbago   . Renal insufficiency    Past Surgical History:  Past Surgical History  Procedure Date  . Knee surgery   . Patellectomy   . Abdominal hysterectomy   . Replacement total knee    Precautions/Restrictions  Precautions Precautions: Fall Restrictions Weight Bearing Restrictions: Yes RUE Weight Bearing: Weight bearing as tolerated LUE Weight Bearing: Weight bearing as tolerated RLE Weight Bearing: Non weight bearing LLE Weight Bearing: Non weight bearing Mobility (including Balance) Bed Mobility Bed Mobility: Yes (Pt I with suping<>EOB; Max A EOB to HOB;nauseous/dizzy) Sitting - Scoot to Edge of Bed: 7: Independent    Exercise  Total Joint Exercises Ankle Circles/Pumps: AROM;Both;10 reps;Supine Quad Sets: AROM;Both;10 reps;Supine Short Arc Quad: AROM;Strengthening;Both;10 reps;Supine Heel Slides: AROM;Strengthening;Both;10 reps;Supine Hip ABduction/ADduction: AROM;Both;10 reps;Supine;Strengthening Straight Leg Raises: AROM;Right;10 reps;Strengthening;Supine (R SL bridgingx10 also) General Exercises - Lower Extremity Ankle Circles/Pumps: AROM;Both;10 reps;Supine Quad Sets: AROM;Both;10 reps;Supine Short Arc Quad: AROM;Strengthening;Both;10 reps;Supine Heel Slides: AROM;Strengthening;Both;10 reps;Supine Hip  ABduction/ADduction: AROM;Both;10 reps;Supine;Strengthening Straight Leg Raises: AROM;Right;10 reps;Strengthening;Supine (R SL bridgingx10 also) Low Level/ICU Exercises Ankle Circles/Pumps: AROM;Both;10 reps;Supine Quad Sets: AROM;Both;10 reps;Supine Short Arc Quad: AROM;Strengthening;Both;10 reps;Supine Hip ABduction/ADduction: AROM;Both;10 reps;Supine;Strengthening Heel Slides: AROM;Strengthening;Both;10 reps;Supine  End of Session PT - End of Session Activity Tolerance:  (limited due to dizzy/nauseau at EOB) Patient left: in bed;with call bell in reach Nurse Communication: Other (comment) (tech needed to aide in moving pt to [facility] and for vitals) General Behavior During Session: [facility] for tasks performed Cognition: [facility] for tasks performed PT Assessment/Plan  PT - Assessment/Plan Comments on Treatment Session: Pt tolerated all bed exercises; once at EOB became "swimmy headed" and nauseous;therapy session discontinued PT Goals  Acute Rehab PT Goals PT Goal: Stand - Progress: Not met  Shatarra Wehling ATKINSO 05/09/2011, 10:08 AM

## 2011-05-09 NOTE — Progress Notes (Signed)
[name], OLHEISER NO.:  [id number]  MEDICAL RECORD NO.:  [id number]  LOCATION:                                 FACILITY:  PHYSICIAN:  Melvyn Novas, MDDATE OF BIRTH:  [birth year]  DATE OF PROCEDURE: DATE OF DISCHARGE:                                PROGRESS NOTE   CSN number [id number].  The patient is currently on dialysis comfortable, has a history of hypertension and chronic renal failure, end-stage renal disease on dialysis, chronic low back pain, depression, hypertension, and some diabetes and BMET reveals BUN of 44, creatinine of 4.79.  Sodium is 133. White count is 11.5, hemoglobin 13.2, platelets are 243,000.  TSH is within normal limits.  Temperature is 97.8, respiratory rate is 18.  O2 sat is 96%, pulse is 58 and regular and blood pressure currently is 155/75.  Patient is alert and oriented, talkative, responds appropriately.  Lungs are clear diminished breath sounds at base.  Heart regular rhythm without murmurs, gallops, or rubs.  Abdomen is essentially benign, chronic pedal edema.  She had a bone scan of her knee which was essentially negative, her right knee.  She has a Engineer, site consult for possible placement in skilled nursing facility. Plan right now is to obtain BMET and CBC on Sunday presumably it is already ordered and I will make further recommendations as the database expands she is somewhat stable.     Melvyn Novas, MD     RMD/MEDQ  D:  05/07/2011  T:  05/07/2011  Job:  [id number]

## 2011-05-10 ENCOUNTER — Inpatient Hospital Stay (HOSPITAL_COMMUNITY): Payer: Medicare Other

## 2011-05-10 MED ORDER — MUPIROCIN 2 % EX OINT: 1.0000 "application " | TOPICAL_OINTMENT | Freq: Two times a day (BID) | CUTANEOUS | Status: AC

## 2011-05-10 MED ORDER — AMLODIPINE BESYLATE 10 MG PO TABS: 10.0000 mg | ORAL_TABLET | Freq: Every day | ORAL | Status: AC

## 2011-05-10 NOTE — Plan of Care (Signed)
Problem: Phase III Progression Outcomes Goal: Discharge plan remains appropriate-arrangements made Outcome: Completed/Met Date Met:  05/10/11 Pt to go home with home health  Problem: Discharge Progression Outcomes Goal: Discharge plan in place and appropriate Outcome: Completed/Met Date Met:  05/10/11 Pt discharged home with home health.  Pt transported home via EMS, with family member meeting them at her house.  Pt instructed on diet and follow - up appts.  Pt verbalizes understanding of diet and appts.

## 2011-05-10 NOTE — Discharge Summary (Signed)
[id number] job number.

## 2011-05-10 NOTE — Consult Note (Signed)
CSW received call from pt's son upset that pt was d/c home.  He reports that he is working on a HCPOA right now to give him rights.  CSW informed son that pt has capacity and a HCPOA will not give him sole decision making.  He feels pt will not have care at home.  Pt told CSW she plans to hire help and did not want family consulted regarding this.  CSW did not provide any information other than that pt has capacity and has chosen to return home.  Karn Cassis

## 2011-05-10 NOTE — Consult Note (Signed)
Pt to D/C home today. CSW arranged for [facility] to provide transportation. CSW will sign off at this time.

## 2011-05-10 NOTE — Discharge Summary (Signed)
[name], [name]              ACCOUNT NO.:  [id number]  MEDICAL RECORD NO.:  [id number]  LOCATION:  A318                          FACILITY:  [facility]  PHYSICIAN:  Mila Homer. Sudie Bailey, M.D.DATE OF BIRTH:  01/10/1930  DATE OF ADMISSION:  05/04/2011 DATE OF DISCHARGE:  07/24/2012LH                              DISCHARGE SUMMARY   This 77 year old was admitted to the hospital May 04, 2011, discharged May 10, 2011.  She had diagnosis of failure to thrive, stage I decubitus ulcers, renal dialysis, chronic kidney failure.  Her admission white cell count was 9200, hemoglobin 12.2.  BUN was 42 with a creatinine 3.12.  Recheck white cell count was 11,500, hemoglobin 13.2.  Recheck BUN 44 with a creatinine of 4.79.  Her sed rate was 24 and her C-reactive protein 2.3.  Her MRSA screen was positive by PCR.  X-ray of her left knee showed replacement of left knee prosthesis within methacrylate prosthesis.  She had lucency at the junction femoral methacrylate with the femoral diaphysis which is felt to be either secondary to loosening or infection.  There was also a minimal lucency surrounding the tibial component of the methacrylate which was felt to be either secondary to loosening or infection or be postsurgical.  Her bone scan of the knees showed a negative three-phase bone scan of the right knee and also impaired delivery __________ lower extremities which is felt to be either secondary to poor cardiac output or atherosclerotic disease with stenosis to the lower extremities.  She also delayed uptake of tracer of the left patella and __________uptake of tracer at the junction of methacrylate component of the distal left femoral prosthesis of the femoral diaphysis which is felt to be secondary to either infection or loosening of the methacrylate component.  She was admitted to the hospital.  She had a benign 7-day hospitalization.  During this time, she had dialysis three times a  week. She was put on Amlodipine 10 mg daily due to elevated blood pressure and continued on losartan 100 mg daily,, citalopram 40 mg daily, oxybutynin 5 mg daily.  She is given Bactroban 2% ointment to the nose b.i.d.  She did well on this regimen.  She is eating well, tolerating dialysis. She is still very weak.  Physical therapy did range of motion exercises with her but she is unable to walk.  We had a long discussion about whether she should go to a nursing home or return home.  For financial reasons, she decided she would need to return home.  Arrangements were to be made by her two sons to have help come out to the house daily.  Home Health was also recommended and arranged for.  FINAL DISCHARGE DIAGNOSES: 1. Failure to thrive. 2. Chronic renal failure. 3. Status post two left total knees, with removal with each one of     these due to infection. 4. Stage I decubitus ulcers of the buttocks. 5. Benign essential hypertension. 6. Reactive depression. 7. Asthma. 8. Lumbago. 9. Renal dialysis status.  Follow-up will be by home visit. >     Mila Homer. Sudie Bailey, M.D.     SDK/MEDQ  D:  05/10/2011  T:  05/10/2011  Job:  [id number]

## 2011-06-22 ENCOUNTER — Ambulatory Visit: Payer: Self-pay | Admitting: Vascular Surgery

## 2011-07-06 ENCOUNTER — Ambulatory Visit (INDEPENDENT_AMBULATORY_CARE_PROVIDER_SITE_OTHER): Payer: Medicare Other | Admitting: Cardiology

## 2011-07-06 ENCOUNTER — Encounter: Payer: Self-pay | Admitting: Cardiology

## 2011-07-06 DIAGNOSIS — N186 End stage renal disease: Secondary | ICD-10-CM

## 2011-07-06 DIAGNOSIS — I10 Essential (primary) hypertension: Secondary | ICD-10-CM

## 2011-07-06 DIAGNOSIS — Z01818 Encounter for other preprocedural examination: Secondary | ICD-10-CM

## 2011-07-06 DIAGNOSIS — R9431 Abnormal electrocardiogram [ECG] [EKG]: Secondary | ICD-10-CM

## 2011-07-06 NOTE — Assessment & Plan Note (Signed)
Blood pressure is well-controlled today. 

## 2011-07-06 NOTE — Assessment & Plan Note (Signed)
Could be related to long-standing hypertension with LVH. Echocardiography will also be obtained to exclude any obvious wall motion abnormalities that would be more consistent with prior infarct scar. Myoview will also be useful in this regard.

## 2011-07-06 NOTE — Progress Notes (Signed)
Clinical Summary Ms. Cunliffe is a 77 y.o.female referred for cardiology consultation by Dr. Levora Dredge prior to planned placement of a left brachial cephalic fistula for hemodialysis access. She is referred with abnormal ECG, no clearly documented history of obstructive CAD or myocardial infarction based on available records.  She is presently in a rehabilitation facility, undergoing physical therapy, history of chronic left knee joint infection requiring explantation of prosthesis. She has limited functional capacity at this point.  When she does physical therapy, she denies any obvious angina, has NYHA class 2-3 dyspnea on exertion. No significant changes over time.  Followup ECG is noted below. She has not undergone any prior cardiac structural or ischemic testing based on available information.   Allergies  Allergen Reactions  . Vancomycin Cross Reactors     Medication list reviewed.  Past Medical History  Diagnosis Date  . Depression   . Essential hypertension, benign   . ESRD (end stage renal disease)   . Asthma   . Anemia of chronic disease   . Lumbago   . Stage I decubitus ulcer and pressure area   . Infection of prosthetic knee joint     Past Surgical History  Procedure Date  . Patellectomy   . Abdominal hysterectomy   . Replacement total knee bilateral     Family History  Problem Relation Age of Onset  . Adopted: Yes    Social History Ms. Haddix reports that she has never smoked. She has never used smokeless tobacco. Ms. Cortese reports that she does not drink alcohol.  Review of Systems No palpitations or syncope. No orthopnea or PND. Has chronic left leg and back pain. Otherwise negative.  Physical Examination Filed Vitals:   07/06/11 1549  BP: 114/46  Pulse: 61  Resp: 18  Overweight woman in no acute distress. HEENT: Conjunctiva and lids normal, oropharynx with moist mucosa. Neck: Supple, no elevated JVP or carotid bruits, no thyromegaly. Lungs:  Clear to auscultation, nonlabored. Cardiac: Regular rate and rhythm, soft systolic murmur at the base, normal second heart sound, S4. No rub. Abdomen: Soft, nontender, bowel sounds present. Skin: Warm and dry. Extremities: Trace ankle edema, some stasis. Musculoskeletal: No kyphosis. Neuropsychiatric: Alert and oriented x3, affect generally appropriate.   ECG Sinus rhythm at 62 with increased voltage, LAFB, poor anterior R wave progression - rule out old infarct pattern.    Problem List and Plan

## 2011-07-06 NOTE — Patient Instructions (Addendum)
Your physician has requested that you have a lexiscan myoview. For further information please visit [email or web address] Please follow instruction sheet, as given.  Your physician has requested that you have an echocardiogram. Echocardiography is a painless test that uses sound waves to create images of your heart. It provides your doctor with information about the size and shape of your heart and how well your heart's chambers and valves are working. This procedure takes approximately one hour. There are no restrictions for this procedure.  Your physician recommends that you schedule a follow-up appointment in: as need (we will contact you with test results)

## 2011-07-06 NOTE — Assessment & Plan Note (Signed)
Patient being considered for left arm fistula placement for hemodialysis. ECG is abnormal as noted, other general cardiac risk factors including end-stage renal disease, long-standing hypertension. She is functionally limited related to chronic left knee infection with explantation of prosthesis, does have dyspnea and exertion but no angina. Plan is to proceed with a Lexiscan Myoview for ischemic evaluation, and then formalize perioperative risk more accurately. Will plan to convey results to the patient and also her surgeon.

## 2011-07-07 LAB — CARDIAC PANEL(CRET KIN+CKTOT+MB+TROPI)
CK, MB: 1.5
CK, MB: 1.8
CK, MB: 2
Relative Index: INVALID
Total CK: 26
Total CK: 28
Total CK: 29
Troponin I: 0.01
Troponin I: 0.03

## 2011-07-07 LAB — DIFFERENTIAL
Basophils Relative: 1
Eosinophils Relative: 10 — ABNORMAL HIGH
Lymphocytes Relative: 11 — ABNORMAL LOW
Lymphocytes Relative: 9 — ABNORMAL LOW
Lymphs Abs: 0.9
Monocytes Absolute: 0.5
Monocytes Absolute: 0.6
Monocytes Relative: 7
Monocytes Relative: 8
Monocytes Relative: 9
Neutro Abs: 5.8
Neutro Abs: 5.9
Neutro Abs: 6.4
Neutrophils Relative %: 73

## 2011-07-07 LAB — CBC
HCT: 27.3 — ABNORMAL LOW
Hemoglobin: 8.9 — ABNORMAL LOW
Hemoglobin: 8.9 — ABNORMAL LOW
MCHC: 32.5
MCV: 93.3
RBC: 2.8 — ABNORMAL LOW
RBC: 2.89 — ABNORMAL LOW
RBC: 2.92 — ABNORMAL LOW
WBC: 7.9

## 2011-07-07 LAB — BASIC METABOLIC PANEL
BUN: 10
CO2: 21
CO2: 21
Calcium: 8.7
Calcium: 8.8
Calcium: 8.9
Calcium: 9.1
Chloride: 108
Chloride: 112
Creatinine, Ser: 0.81
Creatinine, Ser: 0.92
GFR calc Af Amer: 48 — ABNORMAL LOW
GFR calc Af Amer: >60
GFR calc Af Amer: >60
GFR calc Af Amer: >60
GFR calc non Af Amer: 52 — ABNORMAL LOW
GFR calc non Af Amer: 60 — ABNORMAL LOW
Glucose, Bld: 113 — ABNORMAL HIGH
Potassium: 5.5 — ABNORMAL HIGH
Potassium: 7.3
Sodium: 135
Sodium: 138

## 2011-07-07 LAB — POCT CARDIAC MARKERS
CKMB, poc: <1 — ABNORMAL LOW
Troponin i, poc: <0.05

## 2011-07-07 LAB — VANCOMYCIN, TROUGH: Vancomycin Tr: 35.7

## 2011-07-11 ENCOUNTER — Other Ambulatory Visit: Payer: Self-pay | Admitting: Cardiology

## 2011-07-11 DIAGNOSIS — R9431 Abnormal electrocardiogram [ECG] [EKG]: Secondary | ICD-10-CM

## 2011-07-11 DIAGNOSIS — I10 Essential (primary) hypertension: Secondary | ICD-10-CM

## 2011-07-13 ENCOUNTER — Other Ambulatory Visit (HOSPITAL_COMMUNITY): Payer: Medicare Other

## 2011-07-13 ENCOUNTER — Encounter: Payer: Medicare Other | Admitting: *Deleted

## 2011-07-13 ENCOUNTER — Encounter (HOSPITAL_COMMUNITY): Payer: Medicare Other

## 2011-07-13 ENCOUNTER — Encounter (HOSPITAL_COMMUNITY): Admission: RE | Admit: 2011-07-13 | Payer: Medicare Other | Source: Ambulatory Visit

## 2011-07-13 ENCOUNTER — Ambulatory Visit (HOSPITAL_COMMUNITY): Admission: RE | Admit: 2011-07-13 | Payer: Medicare Other | Source: Ambulatory Visit

## 2011-07-19 ENCOUNTER — Encounter: Payer: Self-pay | Admitting: Cardiology

## 2011-07-20 ENCOUNTER — Ambulatory Visit (HOSPITAL_COMMUNITY): Payer: Medicare Other

## 2011-07-20 ENCOUNTER — Encounter: Payer: Medicare Other | Admitting: *Deleted

## 2011-07-20 ENCOUNTER — Encounter (HOSPITAL_COMMUNITY): Payer: Medicare Other

## 2011-07-25 LAB — CBC
Platelets: 457 — ABNORMAL HIGH
RBC: 3.08 — ABNORMAL LOW
WBC: 12.4 — ABNORMAL HIGH

## 2011-07-25 LAB — SEDIMENTATION RATE: Sed Rate: 107 — ABNORMAL HIGH

## 2011-07-25 LAB — DIFFERENTIAL
Basophils Relative: 0
Eosinophils Absolute: 0.3
Lymphs Abs: 0.8
Monocytes Relative: 6
Neutro Abs: 10.5 — ABNORMAL HIGH
Neutrophils Relative %: 85 — ABNORMAL HIGH

## 2011-07-28 ENCOUNTER — Encounter (HOSPITAL_COMMUNITY): Payer: Self-pay | Admitting: Cardiology

## 2011-07-28 ENCOUNTER — Encounter (HOSPITAL_COMMUNITY)
Admission: RE | Admit: 2011-07-28 | Discharge: 2011-07-28 | Disposition: A | Discharge status: Home / Self Care | Payer: Medicare Other | Source: Ambulatory Visit | Attending: Cardiology | Admitting: Cardiology

## 2011-07-28 ENCOUNTER — Ambulatory Visit (INDEPENDENT_AMBULATORY_CARE_PROVIDER_SITE_OTHER): Payer: Medicare Other | Admitting: *Deleted

## 2011-07-28 ENCOUNTER — Encounter (HOSPITAL_COMMUNITY): Payer: Self-pay

## 2011-07-28 ENCOUNTER — Ambulatory Visit (HOSPITAL_COMMUNITY)
Admission: RE | Admit: 2011-07-28 | Discharge: 2011-07-28 | Disposition: A | Discharge status: Home / Self Care | Payer: Medicare Other | Source: Ambulatory Visit | Attending: Cardiology | Admitting: Cardiology

## 2011-07-28 DIAGNOSIS — N186 End stage renal disease: Secondary | ICD-10-CM | POA: Insufficient documentation

## 2011-07-28 DIAGNOSIS — R0789 Other chest pain: Secondary | ICD-10-CM | POA: Insufficient documentation

## 2011-07-28 DIAGNOSIS — Z01818 Encounter for other preprocedural examination: Secondary | ICD-10-CM

## 2011-07-28 DIAGNOSIS — R9431 Abnormal electrocardiogram [ECG] [EKG]: Secondary | ICD-10-CM

## 2011-07-28 DIAGNOSIS — Z01810 Encounter for preprocedural cardiovascular examination: Secondary | ICD-10-CM | POA: Insufficient documentation

## 2011-07-28 DIAGNOSIS — I517 Cardiomegaly: Secondary | ICD-10-CM

## 2011-07-28 DIAGNOSIS — I10 Essential (primary) hypertension: Secondary | ICD-10-CM | POA: Insufficient documentation

## 2011-07-28 MED ORDER — TECHNETIUM TC 99M TETROFOSMIN IV KIT
10.0000 | PACK | Freq: Once | INTRAVENOUS | Status: AC | PRN
  Administered 2011-07-28: 9.7 via INTRAVENOUS

## 2011-07-28 MED ORDER — TECHNETIUM TC 99M TETROFOSMIN IV KIT
30.0000 | PACK | Freq: Once | INTRAVENOUS | Status: AC | PRN
  Administered 2011-07-28: 30 via INTRAVENOUS

## 2011-07-28 NOTE — Progress Notes (Signed)
*  PRELIMINARY RESULTS* Echocardiogram 2D Echocardiogram has been performed.  Conrad [place] 07/28/2011, 12:48 PM

## 2011-07-28 NOTE — Progress Notes (Signed)
Stress Lab Nurses Notes - [facility]  SUBRENA DEVEREUX 07/28/2011  Reason for doing test: Surgical Clearance and abnormal EKG  Type of test: Steffanie Dunn  Nurse performing test: Parke Poisson, RN  Nuclear Medicine Tech: Lyndel Pleasure  Echo Tech: Not Applicable  MD performing test: Ival Bible & Zonia Kief  Family MD: Sudie Bailey  Test explained and consent signed: yes  IV started: 22g jelco, Saline lock flushed, No redness or edema and Saline lock started in radiology  Symptoms: mild SOB  Treatment/Intervention: None  Reason test stopped: protocol completed  After recovery IV was: Discontinued via X-ray tech and No redness or edema  Patient to return to Nuc. Med at : 12:45 pm  Patient discharged: Home  Patient's Condition upon discharge was: stable  Comments: During test BP 110/50& HR 72.  Recovery BP 118/52 & HR 68.  Symptoms resolved in recovery.  Erskine Speed T

## 2011-08-19 ENCOUNTER — Ambulatory Visit: Payer: Self-pay | Admitting: Vascular Surgery

## 2011-10-19 ENCOUNTER — Ambulatory Visit: Payer: Self-pay | Admitting: Vascular Surgery

## 2011-10-19 LAB — POTASSIUM: Potassium: 4.9 mmol/L (ref 3.5–5.1)

## 2011-11-27 ENCOUNTER — Emergency Department (HOSPITAL_COMMUNITY): Payer: Medicare Other

## 2011-11-27 ENCOUNTER — Other Ambulatory Visit: Payer: Self-pay

## 2011-11-27 ENCOUNTER — Encounter (HOSPITAL_COMMUNITY): Payer: Self-pay

## 2011-11-27 ENCOUNTER — Inpatient Hospital Stay (HOSPITAL_COMMUNITY)
Admission: EM | Admit: 2011-11-27 | Discharge: 2011-12-16 | DRG: 870 | Disposition: E | Payer: Medicare Other | Attending: Family Medicine | Admitting: Family Medicine

## 2011-11-27 DIAGNOSIS — G931 Anoxic brain damage, not elsewhere classified: Secondary | ICD-10-CM | POA: Diagnosis not present

## 2011-11-27 DIAGNOSIS — M545 Low back pain, unspecified: Secondary | ICD-10-CM | POA: Diagnosis present

## 2011-11-27 DIAGNOSIS — I120 Hypertensive chronic kidney disease with stage 5 chronic kidney disease or end stage renal disease: Secondary | ICD-10-CM | POA: Diagnosis present

## 2011-11-27 DIAGNOSIS — G929 Unspecified toxic encephalopathy: Secondary | ICD-10-CM | POA: Diagnosis not present

## 2011-11-27 DIAGNOSIS — I10 Essential (primary) hypertension: Secondary | ICD-10-CM

## 2011-11-27 DIAGNOSIS — G92 Toxic encephalopathy: Secondary | ICD-10-CM | POA: Diagnosis not present

## 2011-11-27 DIAGNOSIS — J45909 Unspecified asthma, uncomplicated: Secondary | ICD-10-CM | POA: Diagnosis present

## 2011-11-27 DIAGNOSIS — N186 End stage renal disease: Secondary | ICD-10-CM | POA: Diagnosis present

## 2011-11-27 DIAGNOSIS — Z992 Dependence on renal dialysis: Secondary | ICD-10-CM

## 2011-11-27 DIAGNOSIS — A419 Sepsis, unspecified organism: Principal | ICD-10-CM | POA: Diagnosis present

## 2011-11-27 DIAGNOSIS — L8991 Pressure ulcer of unspecified site, stage 1: Secondary | ICD-10-CM | POA: Diagnosis present

## 2011-11-27 DIAGNOSIS — F329 Major depressive disorder, single episode, unspecified: Secondary | ICD-10-CM | POA: Diagnosis present

## 2011-11-27 DIAGNOSIS — F3289 Other specified depressive episodes: Secondary | ICD-10-CM | POA: Diagnosis present

## 2011-11-27 DIAGNOSIS — N390 Urinary tract infection, site not specified: Secondary | ICD-10-CM | POA: Diagnosis present

## 2011-11-27 DIAGNOSIS — J9600 Acute respiratory failure, unspecified whether with hypoxia or hypercapnia: Secondary | ICD-10-CM | POA: Diagnosis not present

## 2011-11-27 DIAGNOSIS — N189 Chronic kidney disease, unspecified: Secondary | ICD-10-CM | POA: Diagnosis present

## 2011-11-27 DIAGNOSIS — D72829 Elevated white blood cell count, unspecified: Secondary | ICD-10-CM | POA: Diagnosis present

## 2011-11-27 DIAGNOSIS — E876 Hypokalemia: Secondary | ICD-10-CM | POA: Diagnosis not present

## 2011-11-27 DIAGNOSIS — Z96659 Presence of unspecified artificial knee joint: Secondary | ICD-10-CM

## 2011-11-27 DIAGNOSIS — R627 Adult failure to thrive: Secondary | ICD-10-CM | POA: Diagnosis present

## 2011-11-27 DIAGNOSIS — Z66 Do not resuscitate: Secondary | ICD-10-CM | POA: Diagnosis not present

## 2011-11-27 DIAGNOSIS — I4901 Ventricular fibrillation: Secondary | ICD-10-CM | POA: Diagnosis not present

## 2011-11-27 DIAGNOSIS — L89309 Pressure ulcer of unspecified buttock, unspecified stage: Secondary | ICD-10-CM | POA: Diagnosis present

## 2011-11-27 DIAGNOSIS — N039 Chronic nephritic syndrome with unspecified morphologic changes: Secondary | ICD-10-CM | POA: Diagnosis present

## 2011-11-27 DIAGNOSIS — E669 Obesity, unspecified: Secondary | ICD-10-CM | POA: Diagnosis present

## 2011-11-27 DIAGNOSIS — D631 Anemia in chronic kidney disease: Secondary | ICD-10-CM | POA: Diagnosis present

## 2011-11-27 DIAGNOSIS — E46 Unspecified protein-calorie malnutrition: Secondary | ICD-10-CM | POA: Diagnosis present

## 2011-11-27 DIAGNOSIS — Z515 Encounter for palliative care: Secondary | ICD-10-CM

## 2011-11-27 HISTORY — DX: Failure to thrive (child): R62.51

## 2011-11-27 HISTORY — DX: Weakness: R53.1

## 2011-11-27 LAB — URINALYSIS, ROUTINE W REFLEX MICROSCOPIC
Glucose, UA: NEGATIVE mg/dL
Ketones, ur: NEGATIVE mg/dL
Nitrite: NEGATIVE
Specific Gravity, Urine: 1.015 (ref 1.005–1.030)
pH: 6.5 (ref 5.0–8.0)

## 2011-11-27 LAB — CBC
MCV: 91.4 fL (ref 78.0–100.0)
Platelets: 297 10*3/uL (ref 150–400)
RBC: 2.67 MIL/uL — ABNORMAL LOW (ref 3.87–5.11)
RDW: 14.9 % (ref 11.5–15.5)
WBC: 27.8 10*3/uL — ABNORMAL HIGH (ref 4.0–10.5)

## 2011-11-27 LAB — URINE MICROSCOPIC-ADD ON

## 2011-11-27 LAB — BASIC METABOLIC PANEL
CO2: 25 mEq/L (ref 19–32)
Chloride: 98 mEq/L (ref 96–112)
GFR calc Af Amer: 13 mL/min — ABNORMAL LOW (ref >90)
Potassium: 3 mEq/L — ABNORMAL LOW (ref 3.5–5.1)
Sodium: 135 mEq/L (ref 135–145)

## 2011-11-27 LAB — DIFFERENTIAL
Basophils Absolute: 0 10*3/uL (ref 0.0–0.1)
Eosinophils Relative: 1 % (ref 0–5)
Lymphocytes Relative: 3 % — ABNORMAL LOW (ref 12–46)
Lymphs Abs: 0.8 10*3/uL (ref 0.7–4.0)
Monocytes Relative: 4 % (ref 3–12)

## 2011-11-27 MED ORDER — CHLORHEXIDINE GLUCONATE CLOTH 2 % EX PADS
6.0000 | MEDICATED_PAD | Freq: Every day | CUTANEOUS | Status: AC
  Administered 2011-11-28 – 2011-12-02 (×5): 6 via TOPICAL

## 2011-11-27 MED ORDER — CEFTRIAXONE SODIUM 1 G IJ SOLR
1.0000 g | Freq: Once | INTRAMUSCULAR | Status: AC
  Administered 2011-11-27: 1 g via INTRAVENOUS
  Filled 2011-11-27 (×2): qty 10

## 2011-11-27 MED ORDER — SODIUM CHLORIDE 0.9 % IV SOLN: INTRAVENOUS | Status: DC

## 2011-11-27 MED ORDER — AMLODIPINE BESYLATE 5 MG PO TABS
10.0000 mg | ORAL_TABLET | Freq: Every day | ORAL | Status: DC
  Filled 2011-11-27: qty 2

## 2011-11-27 MED ORDER — FLUTICASONE-SALMETEROL 100-50 MCG/DOSE IN AEPB
1.0000 | INHALATION_SPRAY | Freq: Two times a day (BID) | RESPIRATORY_TRACT | Status: DC
Start: 2011-11-27 — End: 2011-12-04
  Administered 2011-11-27: 1 via RESPIRATORY_TRACT
  Filled 2011-11-27: qty 14

## 2011-11-27 MED ORDER — NALOXONE HCL 0.4 MG/ML IJ SOLN
0.4000 mg | Freq: Once | INTRAMUSCULAR | Status: AC
  Administered 2011-11-27: 0.4 mg via INTRAVENOUS
  Filled 2011-11-27: qty 1

## 2011-11-27 MED ORDER — BACLOFEN 10 MG PO TABS
10.0000 mg | ORAL_TABLET | Freq: Two times a day (BID) | ORAL | Status: DC
  Administered 2011-11-28 – 2011-12-03 (×9): 10 mg via ORAL
  Filled 2011-11-27 (×14): qty 1

## 2011-11-27 MED ORDER — ENOXAPARIN SODIUM 40 MG/0.4ML ~~LOC~~ SOLN
40.0000 mg | Freq: Every day | SUBCUTANEOUS | Status: DC
  Administered 2011-11-27: 40 mg via SUBCUTANEOUS
  Filled 2011-11-27: qty 0.4

## 2011-11-27 MED ORDER — DYPHYLLINE 200 MG PO TABS
200.0000 mg | ORAL_TABLET | Freq: Two times a day (BID) | ORAL | Status: DC
  Filled 2011-11-27 (×12): qty 1

## 2011-11-27 MED ORDER — LORATADINE 10 MG PO TABS
10.0000 mg | ORAL_TABLET | Freq: Every day | ORAL | Status: DC
  Administered 2011-11-29 – 2011-12-03 (×5): 10 mg via ORAL
  Filled 2011-11-27 (×5): qty 1

## 2011-11-27 MED ORDER — SODIUM CHLORIDE 0.9 % IV BOLUS (SEPSIS)
250.0000 mL | Freq: Once | INTRAVENOUS | Status: AC
  Administered 2011-11-27: 250 mL via INTRAVENOUS

## 2011-11-27 MED ORDER — OXYBUTYNIN CHLORIDE 5 MG PO TABS
5.0000 mg | ORAL_TABLET | Freq: Every day | ORAL | Status: DC
  Administered 2011-11-29 – 2011-12-03 (×5): 5 mg via ORAL
  Filled 2011-11-27 (×5): qty 1

## 2011-11-27 MED ORDER — DEXTROSE 5 % IV SOLN
1.0000 g | INTRAVENOUS | Status: DC
  Administered 2011-11-28 – 2011-12-01 (×4): 1 g via INTRAVENOUS
  Filled 2011-11-27 (×4): qty 10

## 2011-11-27 MED ORDER — CITALOPRAM HYDROBROMIDE 20 MG PO TABS
40.0000 mg | ORAL_TABLET | Freq: Every day | ORAL | Status: DC
  Administered 2011-11-29 – 2011-12-03 (×5): 40 mg via ORAL
  Filled 2011-11-27 (×5): qty 2

## 2011-11-27 MED ORDER — ATENOLOL 25 MG PO TABS
25.0000 mg | ORAL_TABLET | Freq: Every day | ORAL | Status: DC
  Filled 2011-11-27: qty 1

## 2011-11-27 MED ORDER — MUPIROCIN 2 % EX OINT
1.0000 "application " | TOPICAL_OINTMENT | Freq: Two times a day (BID) | CUTANEOUS | Status: AC
  Administered 2011-11-28 – 2011-12-02 (×9): 1 via NASAL
  Filled 2011-11-27 (×3): qty 22

## 2011-11-27 MED ORDER — THEOPHYLLINE ER 200 MG PO TB12
200.0000 mg | ORAL_TABLET | Freq: Every day | ORAL | Status: DC
  Filled 2011-11-27 (×3): qty 1

## 2011-11-27 MED ORDER — LOSARTAN POTASSIUM 50 MG PO TABS
100.0000 mg | ORAL_TABLET | Freq: Every day | ORAL | Status: DC
  Administered 2011-11-29 – 2011-12-03 (×5): 100 mg via ORAL
  Filled 2011-11-27 (×5): qty 2

## 2011-11-27 NOTE — ED Notes (Signed)
pts daughter left, contact number [phone]--carol Siska

## 2011-11-27 NOTE — H&P (Signed)
[name], [name]              ACCOUNT NO.:  [id number]  MEDICAL RECORD NO.:  [id number]  LOCATION:  A303                          FACILITY:  [facility]  PHYSICIAN:  Steffani Dionisio D. Felecia Shelling, MD   DATE OF BIRTH:  03-12-1930  DATE OF ADMISSION:  11/21/2011 DATE OF DISCHARGE:  LH                             HISTORY & PHYSICAL   CHIEF COMPLAINT:  Change in mental status.  HISTORY OF PRESENT ILLNESS:  This is a 78 year old female patient with history of multiple medical illnesses including end-stage renal failure, on hemodialysis, was brought from nursing home due to change in mental status.  The patient was recently in nursing home, and she has been complaining of pain in her left leg.  She had a recent left knee replacement.  She was receiving frequent pain medications.  This morning, the patient became unresponsive, and there was major change in her mental status.  She was brought to the emergency room where she was evaluated.  Her urinalysis showed sign of urinary tract infection.  The patient was started on Rocephin, and she was admitted for further treatment.  REVIEW OF SYSTEMS:  The patient is currently confused and disoriented, unable to give any history.  PAST MEDICAL HISTORY: 1. Chronic renal failure on hemodialysis. 2. Failure to thrive. 3. History of left total knee replacement. 4. Stage I buttock decubitus ulcer. 5. Hypertension. 6. Depression disorder. 7. Asthma. 8. Lumbago.  CURRENT MEDICATIONS: 1. Acetaminophen 325 mg 2 tablets p.o. q.4 h. 2. Benadryl 25 mg q.4 h. 3. Norco 5/325 two tablets q.6 p.r.n. 4. Ibuprofen 200 mg q.12 h. 5. Promethazine 12.5 mg q.6 h. 6. Ambien 5 mg at bedtime as needed. 7. Albuterol inhaler 2 puffs q.12 h. p.r.n. 8. Baclofen 10 mg b.i.d. 9. Detrol 5 mg daily. 10.Loratadine 10 mg daily.  SOCIAL HISTORY:  The patient is currently a resident of [facility].  No history of alcohol, tobacco, or substance abuse.  FAMILY HISTORY:  Not  available.  PHYSICAL EXAMINATION:  GENERAL:  The patient is chronically sick looking and arousable, but confused and disoriented. VITAL SIGNS:  Blood pressure 114/53, pulse 61, respiratory rate 24, temperature 97.5 degrees Fahrenheit. HEENT:  Pupils are equal and reactive. NECK:  Supple. CHEST:  Decreased air entry, few rhonchi. CARDIOVASCULAR:  First and second heart sounds heard.  No murmur.  No gallop. ABDOMEN:  Soft and lax.  Bowel sound is positive.  No mass or organomegaly. EXTREMITIES:  The patient has some tenderness and redness on her left knee area and lower extremities.  LABORATORY DATA ON ADMISSION:  CBC, WBC 27.8, hemoglobin 8.3, hematocrit 24.4, and platelets 297.  Urinalysis, specific gravity 1.015, pH 6.5, leukocytes large, wbc'S 25-50, bacteria many.  BMP, sodium 135, potassium 3.0, chloride 98, carbon dioxide 25, glucose 123, BUN 27, creatinine 3.56, and calcium 9.5.  ASSESSMENT: 1. Urinary tract infection. 2. Change in mental status secondary to the above. 3. End-stage renal failure, on hemodialysis. 4. Anemia of chronic disease. 5. History of left hip replacement. 6. Failure to thrive. 7. Protein calorie malnutrition.  PLAN:  We will continue the patient on IV Rocephin.  We will do a renal consult.  We will  continue her regular medications.  Continue supportive care.     Josia Cueva D. Felecia Shelling, MD     TDF/MEDQ  D:  2011/12/08  T:  08-Dec-2011  Job:  [id number]

## 2011-11-27 NOTE — ED Provider Notes (Cosign Needed)
History   This chart was scribed for Benny Lennert, MD by Clarita Crane. The patient was seen in room [facility] and the patient's care was started at 9:42AM.   CSN: [id number]  Arrival date & time 11/28/2011  0930   First MD Initiated Contact with Patient 11/26/2011 [phone]      Chief Complaint  Patient presents with  . Altered Mental Status    (Consider location/radiation/quality/duration/timing/severity/associated sxs/prior treatment) Patient is a 78 y.o. female presenting with altered mental status. The history is provided by the nursing home and a relative.  Altered Mental Status This is a new problem. The current episode started 6 to 12 hours ago. The problem occurs constantly. The problem has not changed since onset.Pertinent negatives include no chest pain, no abdominal pain, no headaches and no shortness of breath. The symptoms are aggravated by nothing. The symptoms are relieved by nothing. She has tried nothing for the symptoms.  Patient's daughter notes that patient was placed on pain medication for the past several days after complaining of moderate to severe left lower extremity pain 2-3 days ago. Daughter states patient received injection in LLE and additional pain medication by Dr. Sudie Bailey yesterday to treat pain and several hours later patient began presenting with slurred speech and decreased LOC. Denies recent falls. Patient with h/o end stage renal disease, asthma and is on hemodialysis. Dialysis scheduled for Tuesday, Thursday and Saturday. Last treatment received yesterday. Past Medical History  Diagnosis Date  . Depression   . Essential hypertension, benign   . ESRD (end stage renal disease)   . Asthma   . Anemia of chronic disease   . Lumbago   . Stage i decubitus ulcer and pressure area   . Infection of prosthetic knee joint   . Renal insufficiency   . Failure to thrive   . Generalized weakness   . Pressure ulcer     Past Surgical History  Procedure Date  .  Patellectomy   . Abdominal hysterectomy   . Replacement total knee bilateral   . Peritoneal shunt     Family History  Problem Relation Age of Onset  . Adopted: Yes    History  Substance Use Topics  . Smoking status: Never Smoker   . Smokeless tobacco: Never Used  . Alcohol Use: No  Patient is a resident of [facility] nursing facility.  OB History    Grav Para Term Preterm Abortions TAB SAB Ect Mult Living                  Review of Systems  Constitutional: Negative for fatigue.  HENT: Negative for congestion, sinus pressure and ear discharge.   Eyes: Negative for discharge.  Respiratory: Negative for cough and shortness of breath.   Cardiovascular: Negative for chest pain.  Gastrointestinal: Negative for abdominal pain and diarrhea.  Genitourinary: Negative for frequency and hematuria.  Musculoskeletal: Positive for arthralgias. Negative for back pain.       LLE pain.   Skin: Negative for rash.  Neurological: Negative for seizures and headaches.       AMS  Hematological: Negative.   Psychiatric/Behavioral: Positive for altered mental status. Negative for hallucinations.    Allergies  Vancomycin cross reactors  Home Medications   Current Outpatient Rx  Name Route Sig Dispense Refill  . ACETAMINOPHEN ER 650 MG PO TBCR Oral Take 650 mg by mouth every 4 (four) hours as needed.      . ALBUTEROL SULFATE HFA 108 (90 BASE) MCG/ACT IN  AERS Inhalation Inhale 2 puffs into the lungs every 4 (four) hours as needed. Cough and Wheezing     . AMLODIPINE BESYLATE 10 MG PO TABS Oral Take 1 tablet (10 mg total) by mouth daily. 30 tablet 11  . ATENOLOL 25 MG PO TABS Oral Take 25 mg by mouth daily.      Marland Kitchen CITALOPRAM HYDROBROMIDE 40 MG PO TABS Oral Take 40 mg by mouth daily.      Marland Kitchen DIPHENHYDRAMINE HCL 25 MG PO CAPS Oral Take 25 mg by mouth every 4 (four) hours as needed.      . DYPHYLLINE 200 MG PO TABS Oral Take 200 mg by mouth 2 (two) times daily.      Marland Kitchen FLUTICASONE-SALMETEROL 100-50  MCG/DOSE IN AEPB Inhalation Inhale 1 puff into the lungs every 12 (twelve) hours. Cough and Wheezing     . IBUPROFEN 200 MG PO CAPS Oral Take 200 mg by mouth every 12 (twelve) hours as needed.      Marland Kitchen LORATADINE 10 MG PO TABS Oral Take 10 mg by mouth daily.      Marland Kitchen LOSARTAN POTASSIUM 100 MG PO TABS Oral Take 100 mg by mouth daily.     . OXYBUTYNIN CHLORIDE 5 MG PO TABS Oral Take 5 mg by mouth daily.      Marland Kitchen PROMETHAZINE HCL 12.5 MG PO TABS Oral Take 12.5 mg by mouth every 6 (six) hours as needed.        Physical Exam  Nursing note and vitals reviewed. Constitutional: She appears well-developed and well-nourished.  HENT:  Head: Normocephalic and atraumatic.  Eyes: EOM are normal.       Pupils are pinpoint.   Neck: Neck supple. No tracheal deviation present.  Cardiovascular: Normal rate and regular rhythm.   No murmur heard. Pulmonary/Chest: Effort normal. No respiratory distress. She has no wheezes.  Abdominal: Soft. She exhibits no distension.  Musculoskeletal: Normal range of motion. She exhibits no edema.       Significant swelling and tenderness to left knee. Right subclavian central line present.  Neurological: She is alert. No sensory deficit.       Grip strength normal and equal bilaterally. Lethargic. Oriented to person and place. Able to follow commands.   Skin: Skin is warm and dry.  Psychiatric: Her behavior is normal.    ED Course  Procedures (including critical care time)  DIAGNOSTIC STUDIES:  COORDINATION OF CARE: 9:50AM- Patient's daughter informed of current plan for treatment and evaluation and agrees with plan at this time.  11:22AM- Benny Lennert, MD to bedside to evaluate patient. Patient is hypotensive via monitor blood pressure (87/35) and with decreased LOC compared to initial evaluation in ED. Patient responsive to painful stimuli.  11:43AM- Consult complete with Dr. Felecia Shelling. Patient case explained and discussed. Dr. Felecia Shelling agrees to admit patient for further  evaluation.  Labs Reviewed  CBC - Abnormal; Notable for the following:    WBC 27.8 (*)    RBC 2.67 (*)    Hemoglobin 8.3 (*)    HCT 24.4 (*)    All other components within normal limits  DIFFERENTIAL - Abnormal; Notable for the following:    Neutrophils Relative 92 (*)    Lymphocytes Relative 3 (*)    Neutro Abs 25.6 (*)    Monocytes Absolute 1.1 (*)    All other components within normal limits  BASIC METABOLIC PANEL - Abnormal; Notable for the following:    Potassium 3.0 (*)    Glucose, Bld  123 (*)    BUN 27 (*)    Creatinine, Ser 3.56 (*)    GFR calc non Af Amer 11 (*)    GFR calc Af Amer 13 (*)    All other components within normal limits  URINALYSIS, ROUTINE W REFLEX MICROSCOPIC - Abnormal; Notable for the following:    APPearance CLOUDY (*)    Hgb urine dipstick TRACE (*)    Protein, ur 100 (*)    Leukocytes, UA LARGE (*)    All other components within normal limits  URINE MICROSCOPIC-ADD ON - Abnormal; Notable for the following:    Squamous Epithelial / LPF FEW (*)    Bacteria, UA MANY (*)    All other components within normal limits   Dg Chest 1 View  12-15-11  *RADIOLOGY REPORT*  Clinical Data: Altered mental status  CHEST - 1 VIEW  Comparison: 02/19/2011  Findings: Left costophrenic angle is excluded from this single image.  Mild lingular scarring.  Bilateral lower lobe opacities, likely scarring versus atelectasis.  No pneumothorax.  Mediastinal silhouette is stable.  Right sided dual lumen dialysis catheter.  IMPRESSION: No evidence of acute cardiopulmonary disease.  Mild lingular scarring.  Bilateral lower lobe opacities, likely scarring versus atelectasis.  Original Report Authenticated By: Charline Bills, M.D.   Ct Head Wo Contrast  2011/12/15  *RADIOLOGY REPORT*  Clinical Data: Altered mental status.  CT HEAD WITHOUT CONTRAST  Technique:  Contiguous axial images were obtained from the base of the skull through the vertex without contrast.  Comparison: None.   Findings: Age appropriate atrophy.  Negative for hemorrhage. Negative for acute infarct or mass.  Mild chronic microvascular ischemia in the white matter.  Calvarium is intact.  IMPRESSION: No acute intracranial abnormality.  Original Report Authenticated By: Camelia Phenes, M.D.   Dg Knee Complete 4 Views Right  12-15-2011  *RADIOLOGY REPORT*  Clinical Data: Altered mental status  RIGHT KNEE - COMPLETE 4+ VIEW  Comparison: None.  Findings: No fracture or dislocation is seen.  Status post right total knee arthroplasty with spacer.  No evidence of hardware complication.  The visualized soft tissues are unremarkable.  No suprapatellar knee joint effusion.  IMPRESSION: No fracture or dislocation is seen.  Right total knee arthroplasty without evidence of hardware complication.  Original Report Authenticated By: Charline Bills, M.D.     No diagnosis found.  CRITICAL CARE Performed by: Lexandra Rettke L   Total critical care time:40  Critical care time was exclusive of separately billable procedures and treating other patients.  Critical care was necessary to treat or prevent imminent or life-threatening deterioration.  Critical care was time spent personally by me on the following activities: development of treatment plan with patient and/or surrogate as well as nursing, discussions with consultants, evaluation of patient's response to treatment, examination of patient, obtaining history from patient or surrogate, ordering and performing treatments and interventions, ordering and review of laboratory studies, ordering and review of radiographic studies, pulse oximetry and re-evaluation of patient's condition.   MDM  Altered mental status from uti and narcotics      The chart was scribed for me under my direct supervision.  I personally performed the history, physical, and medical decision making and all procedures in the evaluation of this patient.Benny Lennert, MD 12-15-2011  1224

## 2011-11-27 NOTE — ED Notes (Signed)
Per daughter, pt has been c/o severe left leg pain for several days,she has been xrayed, and reports were neg.  Pt had been given increased amounts of vicodin over the last 3 days.  This am brought to er for ams, she is lethargic, but will answer simple?'s when asked.  Responds to pain.  md to bedside for exam.

## 2011-11-27 NOTE — ED Notes (Signed)
Pt not acting right for last 2 hours.  Family thinks pt may have been given to many pain pills.  Has been having increased pain to left leg

## 2011-11-27 NOTE — ED Notes (Signed)
Pt remains in ct/xray, called by ct-tech, pt's iv pulled out during transfer, will re-insert when pt returns.

## 2011-11-28 ENCOUNTER — Other Ambulatory Visit: Payer: Self-pay

## 2011-11-28 ENCOUNTER — Inpatient Hospital Stay (HOSPITAL_COMMUNITY): Payer: Medicare Other

## 2011-11-28 DIAGNOSIS — I4901 Ventricular fibrillation: Secondary | ICD-10-CM | POA: Diagnosis not present

## 2011-11-28 LAB — COMPREHENSIVE METABOLIC PANEL
AST: 24 U/L (ref 0–37)
Albumin: 2.2 g/dL — ABNORMAL LOW (ref 3.5–5.2)
BUN: 40 mg/dL — ABNORMAL HIGH (ref 6–23)
Calcium: 9.5 mg/dL (ref 8.4–10.5)
Creatinine, Ser: 4.19 mg/dL — ABNORMAL HIGH (ref 0.50–1.10)
Total Bilirubin: 0.4 mg/dL (ref 0.3–1.2)
Total Protein: 6.1 g/dL (ref 6.0–8.3)

## 2011-11-28 LAB — BASIC METABOLIC PANEL
BUN: 37 mg/dL — ABNORMAL HIGH (ref 6–23)
CO2: 24 mEq/L (ref 19–32)
Calcium: 9.8 mg/dL (ref 8.4–10.5)
Chloride: 99 mEq/L (ref 96–112)
Creatinine, Ser: 4.18 mg/dL — ABNORMAL HIGH (ref 0.50–1.10)
Glucose, Bld: 132 mg/dL — ABNORMAL HIGH (ref 70–99)

## 2011-11-28 LAB — CBC
HCT: 30.3 % — ABNORMAL LOW (ref 36.0–46.0)
Hemoglobin: 10.2 g/dL — ABNORMAL LOW (ref 12.0–15.0)
MCV: 90.2 fL (ref 78.0–100.0)
Platelets: 352 10*3/uL (ref 150–400)
RBC: 3.36 MIL/uL — ABNORMAL LOW (ref 3.87–5.11)
WBC: 32 10*3/uL — ABNORMAL HIGH (ref 4.0–10.5)

## 2011-11-28 LAB — DIFFERENTIAL
Basophils Relative: 0 % (ref 0–1)
Eosinophils Relative: 0 % (ref 0–5)
Lymphs Abs: 1 10*3/uL (ref 0.7–4.0)
Monocytes Relative: 3 % (ref 3–12)

## 2011-11-28 LAB — BLOOD GAS, ARTERIAL
Acid-base deficit: 6.5 mmol/L — ABNORMAL HIGH (ref 0.0–2.0)
FIO2: 100 %
MECHVT: 500 mL
O2 Saturation: 98.1 %
RATE: 15 resp/min

## 2011-11-28 LAB — D-DIMER, QUANTITATIVE: D-Dimer, Quant: >20 ug/mL-FEU — ABNORMAL HIGH (ref 0.00–0.48)

## 2011-11-28 LAB — GLUCOSE, CAPILLARY: Glucose-Capillary: 144 mg/dL — ABNORMAL HIGH (ref 70–99)

## 2011-11-28 LAB — PRO B NATRIURETIC PEPTIDE: Pro B Natriuretic peptide (BNP): 22655 pg/mL — ABNORMAL HIGH (ref 0–450)

## 2011-11-28 MED ORDER — LORAZEPAM 2 MG/ML IJ SOLN
0.5000 mg | Freq: Once | INTRAMUSCULAR | Status: AC
  Administered 2011-11-28: 0.5 mg via INTRAVENOUS
  Filled 2011-11-28: qty 1

## 2011-11-28 MED ORDER — SODIUM CHLORIDE 0.9 % IV SOLN
500.0000 mg | Freq: Three times a day (TID) | INTRAVENOUS | Status: DC
  Administered 2011-11-29 – 2011-12-03 (×13): 500 mg via INTRAVENOUS
  Filled 2011-11-28 (×20): qty 5

## 2011-11-28 MED ORDER — DOPAMINE-DEXTROSE 3.2-5 MG/ML-% IV SOLN
INTRAVENOUS | Status: AC
  Filled 2011-11-28: qty 250

## 2011-11-28 MED ORDER — DOPAMINE-DEXTROSE 3.2-5 MG/ML-% IV SOLN
2.0000 ug/kg/min | INTRAVENOUS | Status: DC
  Administered 2011-11-28: 2 ug/kg/min via INTRAVENOUS

## 2011-11-28 MED ORDER — ALBUTEROL SULFATE (5 MG/ML) 0.5% IN NEBU
2.5000 mg | INHALATION_SOLUTION | RESPIRATORY_TRACT | Status: DC
  Administered 2011-11-28 – 2011-12-04 (×36): 2.5 mg via RESPIRATORY_TRACT
  Filled 2011-11-28 (×35): qty 0.5

## 2011-11-28 MED ORDER — HEPARIN SODIUM (PORCINE) 1000 UNIT/ML DIALYSIS
500.0000 [IU] | INTRAMUSCULAR | Status: DC | PRN
  Filled 2011-11-28: qty 1

## 2011-11-28 MED ORDER — POTASSIUM CHLORIDE 10 MEQ/100ML IV SOLN
INTRAVENOUS | Status: AC
  Administered 2011-11-28: 10 meq via INTRAVENOUS
  Filled 2011-11-28: qty 600

## 2011-11-28 MED ORDER — HEPARIN SODIUM (PORCINE) 1000 UNIT/ML DIALYSIS
20.0000 [IU]/kg | INTRAMUSCULAR | Status: DC | PRN
  Administered 2011-11-30 – 2011-12-02 (×2): 1900 [IU] via INTRAVENOUS_CENTRAL
  Filled 2011-11-28: qty 2

## 2011-11-28 MED ORDER — POTASSIUM CHLORIDE 10 MEQ/100ML IV SOLN
10.0000 meq | INTRAVENOUS | Status: AC
  Administered 2011-11-28 – 2011-11-29 (×6): 10 meq via INTRAVENOUS

## 2011-11-28 MED ORDER — LEVETIRACETAM 500 MG/5ML IV SOLN
INTRAVENOUS | Status: AC
  Filled 2011-11-28: qty 10

## 2011-11-28 MED ORDER — SODIUM CHLORIDE 0.9 % IV SOLN
1000.0000 mg | Freq: Once | INTRAVENOUS | Status: AC
  Administered 2011-11-28: 1000 mg via INTRAVENOUS
  Filled 2011-11-28: qty 10

## 2011-11-28 MED ORDER — POTASSIUM CHLORIDE CRYS ER 20 MEQ PO TBCR
20.0000 meq | EXTENDED_RELEASE_TABLET | Freq: Two times a day (BID) | ORAL | Status: DC
  Administered 2011-11-28: 20 meq via ORAL
  Filled 2011-11-28: qty 1

## 2011-11-28 MED ORDER — LINEZOLID 2 MG/ML IV SOLN
600.0000 mg | Freq: Two times a day (BID) | INTRAVENOUS | Status: DC
  Administered 2011-11-28 – 2011-11-30 (×5): 600 mg via INTRAVENOUS
  Filled 2011-11-28 (×6): qty 300

## 2011-11-28 MED ORDER — ARTIFICIAL TEARS OP OINT
TOPICAL_OINTMENT | Freq: Three times a day (TID) | OPHTHALMIC | Status: DC
  Administered 2011-11-29 – 2011-11-30 (×7): via OPHTHALMIC
  Administered 2011-12-01: 1 via OPHTHALMIC
  Administered 2011-12-01 – 2011-12-04 (×9): via OPHTHALMIC
  Filled 2011-11-28: qty 3.5

## 2011-11-28 MED ORDER — VANCOMYCIN HCL IN DEXTROSE 1-5 GM/200ML-% IV SOLN
1000.0000 mg | Freq: Once | INTRAVENOUS | Status: AC
  Administered 2011-11-28: 1000 mg via INTRAVENOUS
  Filled 2011-11-28: qty 200

## 2011-11-28 MED ORDER — LEVETIRACETAM 500 MG/5ML IV SOLN
INTRAVENOUS | Status: AC
  Filled 2011-11-28: qty 5

## 2011-11-28 MED ORDER — SODIUM CHLORIDE 0.9 % IJ SOLN
INTRAMUSCULAR | Status: AC
  Filled 2011-11-28: qty 3

## 2011-11-28 MED ORDER — ENOXAPARIN SODIUM 30 MG/0.3ML ~~LOC~~ SOLN
30.0000 mg | Freq: Every day | SUBCUTANEOUS | Status: DC
  Administered 2011-11-28 – 2011-12-03 (×6): 30 mg via SUBCUTANEOUS
  Filled 2011-11-28 (×6): qty 0.3

## 2011-11-28 NOTE — Progress Notes (Signed)
Gardner Candle RN saw on telemetry monitors at the desk that the patient's heart rate had converted from sinus rhythm to a run of Ventricular fibrillation to sinus bradycardia with heart rate in 30's. When staff arrived to room, pt was found unresponsive with no pulse and no respirations. [facility] called and staffed intervened. Pt was resuscitated and transferred to [facility]. Report given to [place], Charity fundraiser. Caregiver verbalized understanding of report and patients family made aware.

## 2011-11-28 NOTE — Progress Notes (Signed)
[name], BOXER              ACCOUNT NO.:  [id number]  MEDICAL RECORD NO.:  [id number]  LOCATION:  IC05                          FACILITY:  [facility]  PHYSICIAN:  Mila Homer. Sudie Bailey, M.D.DATE OF BIRTH:  16-Oct-1930  DATE OF PROCEDURE:  11/28/2011 DATE OF DISCHARGE:                                PROGRESS NOTE   SUBJECTIVE:  I was called by nursing today.  The patient was being coded.  There is a question whether she may have aspirated.  She was noted to be bradycardic and then she went into ventricular fibrillation, again according to nursing.  OBJECTIVE:  Her most recent temperature is 97.5, pulse 70, respiratory rate 20, blood pressure 148/93 before all this.  At this point after 3 rounds of epinephrine, she went from is unresponsive and essentially flat line to abounding pulse running around 100.  Her heart had a regular rhythm, began about 100 and her lungs were clear.  She was intubated at the time I saw her.  Color looked good.  ASSESSMENT: 1. Cardiopulmonary arrest, question etiology.  I am not sure where     this came from.  She may have had an aspirated plug of material that lead to     hypoxia and eventually to bradycardia and ventricular fibrillation     or there may have been some other causes for this. 2. Presumptive urinary tract infection. 3. Methicillin-resistant Staph aureus positive on PCR. 4. Chronic renal insufficiency on renal dialysis 3 times a week.  PLAN:  She is being transferred to the intensive care unit.  I will have pulmonary and cardiac see her in consultation and to decide whether she should stay here or be transferred to [facility] in [place], [place].     Mila Homer. Sudie Bailey, M.D.     SDK/MEDQ  D:  11/28/2011  T:  11/28/2011  Job:  [id number]

## 2011-11-28 NOTE — Consult Note (Signed)
NAME:  [name], [name]              ACCOUNT NO.:  [id number]  MEDICAL RECORD NO.:  [id number]  LOCATION:  IC05                          FACILITY:  [facility]  PHYSICIAN:  Chastelyn Athens A. Gerilyn Pilgrim, M.D. DATE OF BIRTH:  27-Jan-1930  DATE OF CONSULTATION: DATE OF DISCHARGE:                                CONSULTATION   REASON FOR CONSULTATION:  Myoclonic jerks and altered mental status, status post cardiopulmonary arrest.  This is a 78 year old white female who was admitted to the hospital that presented with altered mental status.  She apparently has had left lower extremity weakness and had been given pain medications and became less responsive. In emergency room, she was noted to have significant urine tract infection and she was admitted for this.  Initial head CT scan has been negative for anything acute.  The patient was on the floor and apparently got a dose of vancomycin and coded abruptly after this.  She was on the monitor and had a progressive drop in heart rate after having a ventricular tachycardia.  She was attended to rather rapidly as she was on the monitor and apparently was coded from 1400-1409, just 9 minutes. Unfortunately, she has remained unresponsive and has been noted to have jerky myoclonic and clonic activity.  She is on dopamine drip to keep her hemodynamically stable.  PHYSICAL EXAMINATION:  GENERAL:  She is intubated.  She is not on any sedatives at this time. HEENT:  Eyes are open. NECK:  Supple. EXTREMITIES:  She does have edema of the upper extremities and also of the legs. ABDOMEN:  Soft. NEUROLOGIC:  Mentation, eyes are open, but she does not attends or track. Does not focuses. Seems to be staring straight ahead.  She does not follow commands. Cranial nerve evaluation shows pupils are 4 mm and very sluggishly reactive.  Oculocephalic reflexes are absent. Cold water calorics tested with the left ear reveals no eye movements.  Corneal reflexes are present,  however.  Gag reflexes present, although markedly diminished. She is noted to have pain-induced colonic and myoclonic activity that is symmetric of the upper and lower extremities. They occur with pain and spontaneously. To deep painful stimuli  sometimes, she has some purposeful flexion of the left upper extremity.  Nothing is noted of the legs or the right upper extremity.  Reflexes are diminished throughout.  ASSESSMENT:  Severe encephalopathy due to toxic metabolic etiology, more precisely from anoxic brain injury.  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  The patient will,therefore, be treated with low-dose Ativan and started on Keppra.  EEG will be obtained.  Serial neurological examination will also be obtained.  Given the initial severe deficits on examination, prognosis seems poor.  We will continue to follow patient and see how she responds, however.     Giuliana Handyside A. Gerilyn Pilgrim, M.D.     KAD/MEDQ  D:  11/28/2011  T:  11/28/2011  Job:  [id number]

## 2011-11-28 NOTE — Consult Note (Signed)
CARDIOLOGY CONSULT NOTE  Patient ID: [name] MRN: [id number] DOB/AGE: June 22, 1930 78 y.o.  Admit date: 11/30/2011 Referring Physician: Sudie Bailey Primary Fransisca Connors, MD, MD Primary Cardiologist: Diona Browner Reason for Consultation: Cardiac Arrest Active Problems:  Cardiac arrest - ventricular fibrillation  Chronic kidney failure  Essential hypertension, benign  HPI: [name] is a 78 year old patient of Dr. Sudie Bailey   arresuscitated from ventricular fibrillation earlier today. The patient was last seen by Dr. Diona Browner in 2012 for preoperative evaluation  prior to possible TKA.   Although a stress test and echocardiogram were normal, and there were no cardiac contraindications to the proposed surgery, it was never performed. . She has a lengthy history of medical problems to include end-stage renal disease on dialysis 3 times a week, hypertension, and are SA chronic anemia, related to chronic renal failure.    The patient is a resident of [facility] home and was admitted on 11/18/2011 secondary to altered mental status.  The patient was found to have a UTI and is being treated for this by Dr. Sudie Bailey. It was also noted on admission, the patient was anemic with a hemoglobin of 8.3, and a potassium of 3.0. She is followed by Dr. Kristian Covey.  She recently developed increased pain in her left leg for which narcotics have been administered. Her son is concerned that this may have been the cause of her impaired mental status.   Today around noon, the patient  suffered  a cardiac arrest with ventricular fibrillation.  No records of that event are available for review.  After multiple doses of epinephrine, sinus rhythm and adequate blood pressure were restored. Review of systems unobtainable    Past Medical History  Diagnosis Date  . Depression   . Essential hypertension, benign   . ESRD (end stage renal disease)   . Asthma   . Anemia of chronic disease   . Lumbago   . Stage i  decubitus ulcer and pressure area   . Infection of prosthetic knee joint   . Renal insufficiency   . Failure to thrive   . Generalized weakness   . Pressure ulcer     Family History  Problem Relation Age of Onset  . Adopted: Yes    History   Social History  . Marital Status: Widowed    Spouse Name: N/A    Number of Children: N/A  . Years of Education: N/A   Occupational History  . Not on file.   Social History Main Topics  . Smoking status: Never Smoker   . Smokeless tobacco: Never Used  . Alcohol Use: No  . Drug Use: No  . Sexually Active: Not Currently    Birth Control/ Protection: None   Other Topics Concern  . Not on file   Social History Narrative  . No narrative on file    Past Surgical History  Procedure Date  . Patellectomy   . Abdominal hysterectomy   . Replacement total knee bilateral   . Peritoneal shunt      Prescriptions prior to admission  Medication Sig Dispense Refill  . acetaminophen (TYLENOL) 325 MG tablet Take 650 mg by mouth every 4 (four) hours as needed. For pain/fever      . amLODipine (NORVASC) 10 MG tablet Take 1 tablet (10 mg total) by mouth daily.  30 tablet  11  . atenolol (TENORMIN) 25 MG tablet Take 25 mg by mouth daily.        . baclofen (LIORESAL) 10 MG tablet  Take 10 mg by mouth 2 (two) times daily.      . citalopram (CELEXA) 40 MG tablet Take 40 mg by mouth daily.        . diphenhydrAMINE (BENADRYL) 25 mg capsule Take 25 mg by mouth every 4 (four) hours as needed. For itching      . Fluticasone-Salmeterol (ADVAIR DISKUS) 100-50 MCG/DOSE AEPB Inhale 1 puff into the lungs every 12 (twelve) hours. Cough and Wheezing       . HYDROcodone-acetaminophen (NORCO) 5-325 MG per tablet Take 2 tablets by mouth every 6 (six) hours as needed. For severe pain      . Ibuprofen (ADVIL) 200 MG CAPS Take 200 mg by mouth every 12 (twelve) hours as needed. For migraine      . losartan (COZAAR) 100 MG tablet Take 100 mg by mouth daily.       Marland Kitchen  oxybutynin (DITROPAN) 5 MG tablet Take 5 mg by mouth daily.        . promethazine (PHENERGAN) 12.5 MG tablet Take 12.5 mg by mouth every 6 (six) hours as needed. For vomiting      . theophylline (THEO-24) 200 MG 24 hr capsule Take 200 mg by mouth daily.      Marland Kitchen zolpidem (AMBIEN) 5 MG tablet Take 5 mg by mouth at bedtime as needed. For sleep      . albuterol (PROVENTIL HFA;VENTOLIN HFA) 108 (90 BASE) MCG/ACT inhaler Inhale 2 puffs into the lungs every 12 (twelve) hours as needed. Cough and Wheezing      . loratadine (CLARITIN) 10 MG tablet Take 10 mg by mouth daily as needed. For allergies        Physical Exam: Blood pressure 102/47, pulse 81, temperature 97.5 F (36.4 C), temperature source Axillary, resp. rate 22, height 5\' 4"  (1.626 m), weight 207 lb 0.2 oz (93.9 kg), SpO2 100.00%.   General:Intubated nonresponsive Head:  No xanthomas.   Normal cephalic and atramatic  Lungs: Rales and rhonchi on ventilator; no marked prolongation of the expiratory phase  Heart: HRRR S1 S2, lungs sounds make auscultation difficult.  Pulses are 2+ & equal.            No carotid bruit. No JVD.  No abdominal bruits. No femoral bruits. Abdomen: Bowel sounds are  absent , abdomen soft and non-tender without masses or                  Hernia's noted. Extremities: No clubbing, cyanosis or edema.  DP +1 Neuro: Unresponsive.  No spontaneous eye movements. He had no response to pain or visual threat, no pupillary response, no spontaneous movement except myoclonic jerking when moderately stimulated by physical contact   Labs:   Lab Results  Component Value Date   WBC 27.8* 21-Dec-2011   HGB 8.3* 12/21/2011   HCT 24.4* Dec 21, 2011   MCV 91.4 21-Dec-2011   PLT 297 21-Dec-2011     Lab 11/28/11 0908  NA 138  K 3.2*  CL 99  CO2 24  BUN 37*  CREATININE 4.18*  CALCIUM 9.8  PROT --  BILITOT --  ALKPHOS --  ALT --  AST --  GLUCOSE 132*   Lab Results  Component Value Date   CKTOTAL 26 10/28/2007   CKMB 1.5  10/28/2007   TROPONINI  Value: 0.03        NO INDICATION OF MYOCARDIAL INJURY. 10/28/2007    Radiology: Dg Chest 1 View  12/21/2011  *RADIOLOGY REPORT*  Clinical Data: Altered mental status  CHEST - 1 VIEW  Comparison: 02/19/2011  Findings: Left costophrenic angle is excluded from this single image.  Mild lingular scarring.  Bilateral lower lobe opacities, likely scarring versus atelectasis.  No pneumothorax.  Mediastinal silhouette is stable.  Right sided dual lumen dialysis catheter.  IMPRESSION: No evidence of acute cardiopulmonary disease.  Mild lingular scarring.  Bilateral lower lobe opacities, likely scarring versus atelectasis.  Original Report Authenticated By: Charline Bills, M.D.   Ct Head Wo Contrast  2011/11/30  *RADIOLOGY REPORT*  Clinical Data: Altered mental status.  CT HEAD WITHOUT CONTRAST  Technique:  Contiguous axial images were obtained from the base of the skull through the vertex without contrast.  Comparison: None.  Findings: Age appropriate atrophy.  Negative for hemorrhage. Negative for acute infarct or mass.  Mild chronic microvascular ischemia in the white matter.  Calvarium is intact.  IMPRESSION: No acute intracranial abnormality.  Original Report Authenticated By: Camelia Phenes, M.D.   Dg Knee Complete 4 Views Right  2011-11-30  *RADIOLOGY REPORT*  Clinical Data: Altered mental status  RIGHT KNEE - COMPLETE 4+ VIEW  Comparison: None.  Findings: No fracture or dislocation is seen.  Status post right total knee arthroplasty with spacer.  No evidence of hardware complication.  The visualized soft tissues are unremarkable.  No suprapatellar knee joint effusion.  IMPRESSION: No fracture or dislocation is seen.  Right total knee arthroplasty without evidence of hardware complication.  Original Report Authenticated By: Charline Bills, M.D.   ZOX:[id number]  ASSESSMENT AND PLAN:   1. Status post ventricular fibrillation cardiac arrest:  She is currently hemodynamically stable  on dopamine 28mcg/kg/min. CMET,CBC, D-dimer, Chest x-ray Pro-BNP. EKG pending. At this moment. She is a full code with no family here to discuss DO NOT RESUSCITATE status. Undetermined etiology of cardiac arrest at this time. It was noted that she was hypokalemic this a.m., but has been repleted. Aspiration is also a consideration. Make further recommendations once all data is available for review. Echocardiogram will be ordered to evaluate for LV function. Unlikely. This is a cardiac etiology as most recent echocardiogram and Myoview in October of 2012 were normal.  2. End-stage renal disease: Creatinine drawn this a.m. 4.18. She is due for dialysis under the direction of Dr. Fausto Skillern.  Bettey Mare. Lyman Bishop NP [facility] 11/28/2011, 3:44 PM   Cardiology Attending Patient interviewed and examined. Discussed with Joni Reining, NP.  Above note annotated and modified based upon my findings.  Case discussed at length with patient's son. 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 Do to noncompliance, continuing infection and progressive renal disease, reinsertion of a new prosthesis has never been performed. Quality of life has not been good in recent months , but she has made some progress in the facility where she has been staying. She has not been severely depressed. Pain has only been a problem for the past few days.  Prognosis is quite uncertain at this time, but myoclonus raises the question of significant neurologic damage related to anoxia. Family did not wish for any additional medical or surgical interventions, but will continue current level of care pending neurologic evaluation and reassessment over the next 24-48 hours. The cause of her cardiac arrhythmia is uncertain, but I suspect it is not primarily of cardiac origin. Myocardial infarction will be ruled out. EKG post arrest shows  no acute abnormalities. She may have suffered  hypoxia were primarily a respiratory arrest, which led to her cardiac arrest. Severe hypokalemia may have contributed. An echocardiogram will be performed to determine whether there are new segmental wall motion abnormalities.  [place] Bing, MD 11/28/2011, 7:00 PM

## 2011-11-28 NOTE — Progress Notes (Signed)
ANTIBIOTIC CONSULT NOTE - INITIAL  Pharmacy Consult for Zyvox Indication: rule out pneumonia, rule out uti  Allergies  Allergen Reactions  . Vancomycin Other (See Comments)    Not certain she is allergic. Had received Vancomycin in the past with no problems, however, coded on 12/25/2011 after 1 dose of Vanco. Dr. Sudie Bailey not sure this was the cause of code blue but will use Zyvox in place.    Patient Measurements: Height: 5\' 4"  (162.6 cm) Weight: 207 lb 0.2 oz (93.9 kg) IBW/kg (Calculated) : 54.7  Adjusted Body Weight:   Vital Signs: Temp: 97.5 F (36.4 C) 12-24-22 0549) Temp src: Axillary 12/24/2022 0549) BP: 102/47 mmHg 12-24-2022 1515) Pulse Rate: 81  24-Dec-2022 1515) Intake/Output from previous day:   Intake/Output from this shift:    Labs:  Basename December 25, 2011 0908 12/14/2011 0951  WBC -- 27.8*  HGB -- 8.3*  PLT -- 297  LABCREA -- --  CREATININE 4.18* 3.56*   Estimated Creatinine Clearance: 12.1 ml/min (by C-G formula based on Cr of 4.18).    Microbiology: Recent Results (from the past 720 hour(s))  CULTURE, BLOOD (ROUTINE X 2)     Status: Normal (Preliminary result)   Collection Time   11/22/2011 11:30 AM      Component Value Range Status Comment   Specimen Description BLOOD RIGHT HAND   Final    Special Requests BOTTLES DRAWN AEROBIC ONLY 6CC BOTTLE   Final    Culture NO GROWTH 1 DAY   Final    Report Status PENDING   Incomplete   CULTURE, BLOOD (ROUTINE X 2)     Status: Normal (Preliminary result)   Collection Time   12/10/2011 12:00 PM      Component Value Range Status Comment   Specimen Description BLOOD LEFT HAND   Final    Special Requests BOTTLES DRAWN AEROBIC ONLY 6CC BOTTLE   Final    Culture NO GROWTH 1 DAY   Final    Report Status PENDING   Incomplete   MRSA PCR SCREENING     Status: Abnormal   Collection Time   12/04/2011  5:14 PM      Component Value Range Status Comment   MRSA by PCR POSITIVE (*) NEGATIVE  Final     Medical History: Past Medical History    Diagnosis Date  . Depression   . Essential hypertension, benign   . ESRD (end stage renal disease)   . Asthma   . Anemia of chronic disease   . Lumbago   . Stage i decubitus ulcer and pressure area   . Infection of prosthetic knee joint   . Renal insufficiency   . Failure to thrive   . Generalized weakness   . Pressure ulcer     Medications:  Scheduled:    . albuterol  2.5 mg Nebulization Q4H  . amLODipine  10 mg Oral Daily  . atenolol  25 mg Oral Daily  . baclofen  10 mg Oral BID  . cefTRIAXone (ROCEPHIN)  IV  1 g Intravenous Q24H  . Chlorhexidine Gluconate Cloth  6 each Topical Q0600  . citalopram  40 mg Oral Daily  . DOPamine      . dyphylline  200 mg Oral BID  . enoxaparin (LOVENOX) injection  30 mg Subcutaneous Daily  . Fluticasone-Salmeterol  1 puff Inhalation Q12H  . linezolid  600 mg Intravenous Q12H  . loratadine  10 mg Oral Daily  . losartan  100 mg Oral Daily  . mupirocin  ointment  1 application Nasal BID  . oxybutynin  5 mg Oral Daily  . potassium chloride  20 mEq Oral BID  . sodium chloride      . theophylline  200 mg Oral Daily  . vancomycin  1,000 mg Intravenous Once  . DISCONTD: enoxaparin (LOVENOX) injection  40 mg Subcutaneous Daily   Assessment: Changing therapy to Zyvox from Vancomycin. Patient coded shortly after Vancomycin was infused.Patient had received Vancomycin in the past with no problems but there was an entry in the allergy field stating cross sensitivity to Vancomycin. Patient and daughter both stated no such allergy existed when asked by nurse.  Goal of Therapy:   Eradication of infection.  Plan:   Zyvox 600 mg IV every 12 hours.  Caryl Asp 11/28/2011,3:56 PM

## 2011-11-28 NOTE — Consult Note (Signed)
Reason for Consult: Referring Physician:   [name] is an 78 y.o. female.  HPI:   Past Medical History  Diagnosis Date  . Depression   . Essential hypertension, benign   . ESRD (end stage renal disease)   . Asthma   . Anemia of chronic disease   . Lumbago   . Stage i decubitus ulcer and pressure area   . Infection of prosthetic knee joint   . Renal insufficiency   . Failure to thrive   . Generalized weakness   . Pressure ulcer     Past Surgical History  Procedure Date  . Patellectomy   . Abdominal hysterectomy   . Replacement total knee bilateral   . Peritoneal shunt     Family History  Problem Relation Age of Onset  . Adopted: Yes    Social History:  reports that she has never smoked. She has never used smokeless tobacco. She reports that she does not drink alcohol or use illicit drugs.  Allergies:  Allergies  Allergen Reactions  . Vancomycin Other (See Comments)    Not certain she is allergic. Had received Vancomycin in the past with no problems, however, coded on Dec 07, 2011 after 1 dose of Vanco. Dr. Sudie Bailey not sure this was the cause of code blue but will use Zyvox in place.    Medications:  Prior to Admission medications   Medication Sig Start Date End Date Taking? Authorizing Provider  acetaminophen (TYLENOL) 325 MG tablet Take 650 mg by mouth every 4 (four) hours as needed. For pain/fever   Yes Historical Provider, MD  amLODipine (NORVASC) 10 MG tablet Take 1 tablet (10 mg total) by mouth daily. 05/10/11 05/09/12 Yes Milana Obey, MD  atenolol (TENORMIN) 25 MG tablet Take 25 mg by mouth daily.     Yes Historical Provider, MD  baclofen (LIORESAL) 10 MG tablet Take 10 mg by mouth 2 (two) times daily. 11/26/11  Yes Historical Provider, MD  citalopram (CELEXA) 40 MG tablet Take 40 mg by mouth daily.     Yes Historical Provider, MD  diphenhydrAMINE (BENADRYL) 25 mg capsule Take 25 mg by mouth every 4 (four) hours as needed. For itching   Yes Historical  Provider, MD  Fluticasone-Salmeterol (ADVAIR DISKUS) 100-50 MCG/DOSE AEPB Inhale 1 puff into the lungs every 12 (twelve) hours. Cough and Wheezing    Yes Historical Provider, MD  HYDROcodone-acetaminophen (NORCO) 5-325 MG per tablet Take 2 tablets by mouth every 6 (six) hours as needed. For severe pain   Yes Historical Provider, MD  Ibuprofen (ADVIL) 200 MG CAPS Take 200 mg by mouth every 12 (twelve) hours as needed. For migraine   Yes Historical Provider, MD  losartan (COZAAR) 100 MG tablet Take 100 mg by mouth daily.    Yes Historical Provider, MD  oxybutynin (DITROPAN) 5 MG tablet Take 5 mg by mouth daily.     Yes Historical Provider, MD  promethazine (PHENERGAN) 12.5 MG tablet Take 12.5 mg by mouth every 6 (six) hours as needed. For vomiting   Yes Historical Provider, MD  theophylline (THEO-24) 200 MG 24 hr capsule Take 200 mg by mouth daily.   Yes Historical Provider, MD  zolpidem (AMBIEN) 5 MG tablet Take 5 mg by mouth at bedtime as needed. For sleep   Yes Historical Provider, MD  albuterol (PROVENTIL HFA;VENTOLIN HFA) 108 (90 BASE) MCG/ACT inhaler Inhale 2 puffs into the lungs every 12 (twelve) hours as needed. Cough and Wheezing    Historical Provider, MD  loratadine (CLARITIN) 10 MG tablet Take 10 mg by mouth daily as needed. For allergies    Historical Provider, MD   Scheduled Meds:   . albuterol  2.5 mg Nebulization Q4H  . amLODipine  10 mg Oral Daily  . artificial tears   Both Eyes Q8H  . atenolol  25 mg Oral Daily  . baclofen  10 mg Oral BID  . cefTRIAXone (ROCEPHIN)  IV  1 g Intravenous Q24H  . Chlorhexidine Gluconate Cloth  6 each Topical Q0600  . citalopram  40 mg Oral Daily  . DOPamine      . dyphylline  200 mg Oral BID  . enoxaparin (LOVENOX) injection  30 mg Subcutaneous Daily  . Fluticasone-Salmeterol  1 puff Inhalation Q12H  . linezolid  600 mg Intravenous Q12H  . loratadine  10 mg Oral Daily  . losartan  100 mg Oral Daily  . mupirocin ointment  1 application Nasal  BID  . oxybutynin  5 mg Oral Daily  . potassium chloride  10 mEq Intravenous Q1 Hr x 6  . potassium chloride  20 mEq Oral BID  . sodium chloride      . theophylline  200 mg Oral Daily  . vancomycin  1,000 mg Intravenous Once  . DISCONTD: enoxaparin (LOVENOX) injection  40 mg Subcutaneous Daily   Continuous Infusions:   . DOPamine 2 mcg/kg/min (11/28/11 1600)  . DISCONTD: sodium chloride 50 mL/hr at 12/01/2011 1400   PRN Meds:.heparin, heparin   Results for orders placed during the hospital encounter of 12/04/2011 (from the past 48 hour(s))  CBC     Status: Abnormal   Collection Time   12/04/2011  9:51 AM      Component Value Range Comment   WBC 27.8 (*) 4.0 - 10.5 (K/uL)    RBC 2.67 (*) 3.87 - 5.11 (MIL/uL)    Hemoglobin 8.3 (*) 12.0 - 15.0 (g/dL)    HCT 62.1 (*) 30.8 - 46.0 (%)    MCV 91.4  78.0 - 100.0 (fL)    MCH 31.1  26.0 - 34.0 (pg)    MCHC 34.0  30.0 - 36.0 (g/dL)    RDW 65.7  84.6 - 96.2 (%)    Platelets 297  150 - 400 (K/uL)   DIFFERENTIAL     Status: Abnormal   Collection Time   11/26/2011  9:51 AM      Component Value Range Comment   Neutrophils Relative 92 (*) 43 - 77 (%)    Lymphocytes Relative 3 (*) 12 - 46 (%)    Monocytes Relative 4  3 - 12 (%)    Eosinophils Relative 1  0 - 5 (%)    Basophils Relative 0  0 - 1 (%)    Neutro Abs 25.6 (*) 1.7 - 7.7 (K/uL)    Lymphs Abs 0.8  0.7 - 4.0 (K/uL)    Monocytes Absolute 1.1 (*) 0.1 - 1.0 (K/uL)    Eosinophils Absolute 0.3  0.0 - 0.7 (K/uL)    Basophils Absolute 0.0  0.0 - 0.1 (K/uL)    WBC Morphology WHITE COUNT CONFIRMED ON SMEAR     BASIC METABOLIC PANEL     Status: Abnormal   Collection Time   12/14/2011  9:51 AM      Component Value Range Comment   Sodium 135  135 - 145 (mEq/L)    Potassium 3.0 (*) 3.5 - 5.1 (mEq/L)    Chloride 98  96 - 112 (mEq/L)    CO2 25  19 - 32 (mEq/L)    Glucose, Bld 123 (*) 70 - 99 (mg/dL)    BUN 27 (*) 6 - 23 (mg/dL)    Creatinine, Ser 1.61 (*) 0.50 - 1.10 (mg/dL)    Calcium 9.3  8.4 -  10.5 (mg/dL)    GFR calc non Af Amer 11 (*) >90 (mL/min)    GFR calc Af Amer 13 (*) >90 (mL/min)   URINALYSIS, ROUTINE W REFLEX MICROSCOPIC     Status: Abnormal   Collection Time   12/13/2011 10:08 AM      Component Value Range Comment   Color, Urine YELLOW  YELLOW     APPearance CLOUDY (*) CLEAR     Specific Gravity, Urine 1.015  1.005 - 1.030     pH 6.5  5.0 - 8.0     Glucose, UA NEGATIVE  NEGATIVE (mg/dL)    Hgb urine dipstick TRACE (*) NEGATIVE     Bilirubin Urine NEGATIVE  NEGATIVE     Ketones, ur NEGATIVE  NEGATIVE (mg/dL)    Protein, ur 096 (*) NEGATIVE (mg/dL)    Urobilinogen, UA 0.2  0.0 - 1.0 (mg/dL)    Nitrite NEGATIVE  NEGATIVE     Leukocytes, UA LARGE (*) NEGATIVE    URINE MICROSCOPIC-ADD ON     Status: Abnormal   Collection Time   12/13/2011 10:08 AM      Component Value Range Comment   Squamous Epithelial / LPF FEW (*) RARE     WBC, UA 21-50  <3 (WBC/hpf)    RBC / HPF 7-10  <3 (RBC/hpf)    Bacteria, UA MANY (*) RARE    CULTURE, BLOOD (ROUTINE X 2)     Status: Normal (Preliminary result)   Collection Time   11/29/2011 11:30 AM      Component Value Range Comment   Specimen Description BLOOD RIGHT HAND      Special Requests BOTTLES DRAWN AEROBIC ONLY 6CC BOTTLE      Culture NO GROWTH 1 DAY      Report Status PENDING     LACTIC ACID, PLASMA     Status: Normal   Collection Time   11/25/2011 12:00 PM      Component Value Range Comment   Lactic Acid, Venous 1.7  0.5 - 2.2 (mmol/L)   CULTURE, BLOOD (ROUTINE X 2)     Status: Normal (Preliminary result)   Collection Time   12/07/2011 12:00 PM      Component Value Range Comment   Specimen Description BLOOD LEFT HAND      Special Requests BOTTLES DRAWN AEROBIC ONLY 6CC BOTTLE      Culture NO GROWTH 1 DAY      Report Status PENDING     MRSA PCR SCREENING     Status: Abnormal   Collection Time   11/30/2011  5:14 PM      Component Value Range Comment   MRSA by PCR POSITIVE (*) NEGATIVE    GLUCOSE, CAPILLARY     Status: Abnormal    Collection Time   11/30/2011 11:33 PM      Component Value Range Comment   Glucose-Capillary 106 (*) 70 - 99 (mg/dL)   BASIC METABOLIC PANEL     Status: Abnormal   Collection Time   11/28/11  9:08 AM      Component Value Range Comment   Sodium 138  135 - 145 (mEq/L)    Potassium 3.2 (*) 3.5 - 5.1 (mEq/L)    Chloride 99  96 -  112 (mEq/L)    CO2 24  19 - 32 (mEq/L)    Glucose, Bld 132 (*) 70 - 99 (mg/dL)    BUN 37 (*) 6 - 23 (mg/dL)    Creatinine, Ser 1.61 (*) 0.50 - 1.10 (mg/dL)    Calcium 9.8  8.4 - 10.5 (mg/dL)    GFR calc non Af Amer 9 (*) >90 (mL/min)    GFR calc Af Amer 11 (*) >90 (mL/min)   BLOOD GAS, ARTERIAL     Status: Abnormal   Collection Time   11/28/11  3:25 PM      Component Value Range Comment   FIO2 100.00      Delivery systems VENTILATOR      Mode PRESSURE REGULATED VOLUME CONTROL      VT 500      Rate 15.0      pH, Arterial 7.346 (*) 7.350 - 7.400     pCO2 arterial 33.5 (*) 35.0 - 45.0 (mmHg)    pO2, Arterial 487.0 (*) 80.0 - 100.0 (mmHg)    Bicarbonate 17.9 (*) 20.0 - 24.0 (mEq/L)    TCO2 16.7  0 - 100 (mmol/L)    Acid-base deficit 6.5 (*) 0.0 - 2.0 (mmol/L)    O2 Saturation 98.1      Collection site RIGHT BRACHIAL      Drawn by COLLECTED BY RT      Sample type ARTERIAL      Allens test (pass/fail) NOT INDICATED (*) PASS    COMPREHENSIVE METABOLIC PANEL     Status: Abnormal   Collection Time   11/28/11  4:42 PM      Component Value Range Comment   Sodium 139  135 - 145 (mEq/L)    Potassium 2.7 (*) 3.5 - 5.1 (mEq/L)    Chloride 101  96 - 112 (mEq/L)    CO2 19  19 - 32 (mEq/L)    Glucose, Bld 207 (*) 70 - 99 (mg/dL)    BUN 40 (*) 6 - 23 (mg/dL)    Creatinine, Ser 0.96 (*) 0.50 - 1.10 (mg/dL)    Calcium 9.5  8.4 - 10.5 (mg/dL)    Total Protein 6.1  6.0 - 8.3 (g/dL)    Albumin 2.2 (*) 3.5 - 5.2 (g/dL)    AST 24  0 - 37 (U/L)    ALT 18  0 - 35 (U/L)    Alkaline Phosphatase 263 (*) 39 - 117 (U/L)    Total Bilirubin 0.4  0.3 - 1.2 (mg/dL)    GFR calc non  Af Amer 9 (*) >90 (mL/min)    GFR calc Af Amer 11 (*) >90 (mL/min)   CBC     Status: Abnormal   Collection Time   11/28/11  4:42 PM      Component Value Range Comment   WBC 32.0 (*) 4.0 - 10.5 (K/uL)    RBC 3.36 (*) 3.87 - 5.11 (MIL/uL)    Hemoglobin 10.2 (*) 12.0 - 15.0 (g/dL)    HCT 04.5 (*) 40.9 - 46.0 (%)    MCV 90.2  78.0 - 100.0 (fL)    MCH 30.4  26.0 - 34.0 (pg)    MCHC 33.7  30.0 - 36.0 (g/dL)    RDW 81.1  91.4 - 78.2 (%)    Platelets 352  150 - 400 (K/uL)   DIFFERENTIAL     Status: Abnormal   Collection Time   11/28/11  4:42 PM      Component Value Range Comment  Neutrophils Relative 94 (*) 43 - 77 (%)    Lymphocytes Relative 3 (*) 12 - 46 (%)    Monocytes Relative 3  3 - 12 (%)    Eosinophils Relative 0  0 - 5 (%)    Basophils Relative 0  0 - 1 (%)    Neutro Abs 30.0 (*) 1.7 - 7.7 (K/uL)    Lymphs Abs 1.0  0.7 - 4.0 (K/uL)    Monocytes Absolute 1.0  0.1 - 1.0 (K/uL)    Eosinophils Absolute 0.0  0.0 - 0.7 (K/uL)    Basophils Absolute 0.0  0.0 - 0.1 (K/uL)    RBC Morphology POLYCHROMASIA PRESENT   RARE NRBCs   WBC Morphology WHITE COUNT CONFIRMED ON SMEAR      Smear Review LARGE PLATELETS PRESENT   GIANT PLATELETS SEEN  D-DIMER, QUANTITATIVE     Status: Abnormal   Collection Time   11/28/11  4:42 PM      Component Value Range Comment   D-Dimer, Quant >20.00 (*) 0.00 - 0.48 (ug/mL-FEU)   PRO B NATRIURETIC PEPTIDE     Status: Abnormal   Collection Time   11/28/11  4:43 PM      Component Value Range Comment   Pro B Natriuretic peptide (BNP) 22655.0 (*) 0 - 450 (pg/mL)     Dg Chest 1 View  11/25/2011  *RADIOLOGY REPORT*  Clinical Data: Altered mental status  CHEST - 1 VIEW  Comparison: 02/19/2011  Findings: Left costophrenic angle is excluded from this single image.  Mild lingular scarring.  Bilateral lower lobe opacities, likely scarring versus atelectasis.  No pneumothorax.  Mediastinal silhouette is stable.  Right sided dual lumen dialysis catheter.  IMPRESSION: No  evidence of acute cardiopulmonary disease.  Mild lingular scarring.  Bilateral lower lobe opacities, likely scarring versus atelectasis.  Original Report Authenticated By: Charline Bills, M.D.   Ct Head Wo Contrast  12/01/2011  *RADIOLOGY REPORT*  Clinical Data: Altered mental status.  CT HEAD WITHOUT CONTRAST  Technique:  Contiguous axial images were obtained from the base of the skull through the vertex without contrast.  Comparison: None.  Findings: Age appropriate atrophy.  Negative for hemorrhage. Negative for acute infarct or mass.  Mild chronic microvascular ischemia in the white matter.  Calvarium is intact.  IMPRESSION: No acute intracranial abnormality.  Original Report Authenticated By: Camelia Phenes, M.D.   Dg Chest Port 1 View  11/28/2011  *RADIOLOGY REPORT*  Clinical Data: Cardiac arrest status post intubation.  PORTABLE CHEST - 1 VIEW  Comparison:  12/07/2011  Findings: Dialysis catheter tip SVC/RA junction.  Cardiomegaly. Left pleural effusion moderate sized.  Early pulmonary edema. Nasogastric tube overlies the endotracheal tube.  Endotracheal tube tip felt to lie 5.6 cm above carina.  No pneumothorax. Worsening aeration.  IMPRESSION: ET tube tip 5.6 cm above carina.  NG tube.  Cardiomegaly with early pulmonary edema.  Left pleural effusion.  Original Report Authenticated By: Elsie Stain, M.D.   Dg Knee Complete 4 Views Right  11/20/2011  *RADIOLOGY REPORT*  Clinical Data: Altered mental status  RIGHT KNEE - COMPLETE 4+ VIEW  Comparison: None.  Findings: No fracture or dislocation is seen.  Status post right total knee arthroplasty with spacer.  No evidence of hardware complication.  The visualized soft tissues are unremarkable.  No suprapatellar knee joint effusion.  IMPRESSION: No fracture or dislocation is seen.  Right total knee arthroplasty without evidence of hardware complication.  Original Report Authenticated By: Charline Bills, M.D.  Review of Systems  : patient  unresponsive   Blood pressure 143/26, pulse 78, temperature 97.5 F (36.4 C), temperature source Axillary, resp. rate 15, height 5\' 4"  (1.626 m), weight 93.9 kg (207 lb 0.2 oz), SpO2 100.00%. Physical Exam  Assessment/Plan: See dictation    [name] 11/28/2011, 7:12 PM

## 2011-11-28 NOTE — Progress Notes (Signed)
[name], [name]              ACCOUNT NO.:  [id number]  MEDICAL RECORD NO.:  [id number]  LOCATION:  A303                          FACILITY:  [facility]  PHYSICIAN:  Mila Homer. Sudie Bailey, M.D.DATE OF BIRTH:  December 18, 1929  DATE OF PROCEDURE:  11/28/2011 DATE OF DISCHARGE:                                PROGRESS NOTE   SUBJECTIVE:  The patient is not responding to say anything, but is able to shake her head vehemently at times.  OBJECTIVE:  VITAL SIGNS:  Temperature is 97.5, pulse 70, respiratory rate 20, blood pressure 140/93. LUNGS:  Appear to be clear throughout.  Her color is good off oxygen. HEART:  Regular rhythm and rate of about 60 on my exam. ABDOMEN:  Soft, but she seems to have more tenderness on palpation around the CVAs bilaterally.  LABORATORY DATA: Her white cell count on admission was 27,800 of which 92% were neutrophils.  Hemoglobin is 8.3, potassium 3.0, BUN 27, creatinine 3.56. Her urine showed large leukocytes on stick.  Under the microscope, 21-50 WBCs, 7-10 RBCs, and many bacteria. Blood cultures x2 are pending.  MRSA by PCR is positive.  Urine culture is also pending.  DIAGNOSTICS: 1. Chest x-ray showed bilateral lower lobe opacities felt to be     scarring or atelectasis. 2. Examination of her knee showed she was status post right total knee     arthroplasty with spacer, but no hardware complication noted.  ASSESSMENT: 1. Presumptive urinary tract infection. 2. Methicillin-resistant Staphylococcus aureus positive. 3. Status post two total knees each of which became infected and now     with a spacer. 4. Chronic renal insufficiency on renal dialysis 3 times a week. 5. Hypokalemia. 6. Benign essential hypertension. 7. Chronic anemia related to chronic renal failure. 8. Chronic asthma.  PLAN: 1. Continue ceftriaxone which was already ordered by Dr. Felecia Shelling on-call     for me. 2. Add vancomycin given the MRSA positivity. 3. Add potassium 20 mEq b.i.d. 4.  Recheck a CBC and BMP tomorrow. 5. Nephrology consult pending.     Mila Homer. Sudie Bailey, M.D.     SDK/MEDQ  D:  11/28/2011  T:  11/28/2011  Job:  [id number]

## 2011-11-28 NOTE — Consult Note (Signed)
Consult requested by: Dr. Sudie Bailey Consult requested for respiratory failure:  HPI: This is a 78 year old who is a nursing home resident. Her son says that she's been having more problems with leg pain over the last 3 days or so to the point that she was eventually sent to the hospital from the nursing home. She initially did well but then had CODE BLUE called today. She was resuscitated eventually and placed on mechanical ventilation. She has been unresponsive since that and is now having some tonic-clonic motion and I'm not sure if that's really seizures or if it's some sort of posturing. Her eyes are open but do not track and she does not have any real pupillary reflex.  Past Medical History  Diagnosis Date  . Depression   . Essential hypertension, benign   . ESRD (end stage renal disease)   . Asthma   . Anemia of chronic disease   . Lumbago   . Stage i decubitus ulcer and pressure area   . Infection of prosthetic knee joint   . Renal insufficiency   . Failure to thrive   . Generalized weakness   . Pressure ulcer      Family History  Problem Relation Age of Onset  . Adopted: Yes     History   Social History  . Marital Status: Widowed    Spouse Name: N/A    Number of Children: N/A  . Years of Education: N/A   Social History Main Topics  . Smoking status: Never Smoker   . Smokeless tobacco: Never Used  . Alcohol Use: No  . Drug Use: No  . Sexually Active: Not Currently    Birth Control/ Protection: None   Other Topics Concern  . None   Social History Narrative  . None     ROS: Unobtainable    Objective: Vital signs in last 24 hours: Temp:  [97.3 F (36.3 C)-97.5 F (36.4 C)] 97.5 F (36.4 C) (02/11 1600) Pulse Rate:  [59-94] 77  (02/11 1800) Resp:  [15-25] 15  (02/11 1800) BP: (100-148)/(23-93) 125/31 mmHg (02/11 1800) SpO2:  [84 %-100 %] 100 % (02/11 1800) FiO2 (%):  [100 %] 100 % (02/11 1400) Weight:  [93.9 kg (207 lb 0.2 oz)] 93.9 kg (207 lb 0.2  oz) (02/11 1515) Weight change:  Last BM Date:  (unknown)  Intake/Output from previous day:    PHYSICAL EXAM She is intubated and on the ventilator. She has essentially nonreactive pupils. She does not follow with her eyes. She has jerking motions when she's touched and occasionally when she is not. Her neck is fairly supple. Her heart is regular. Her abdomen is soft without masses. Her extreme he shows that she has multiple surgical scars on her knee. Central nervous system examination shows a she's essentially flaccid and unresponsive except when she's having one of these tonic clonic jerks. Her chest shows some rhonchi bilaterally.  Lab Results: Basic Metabolic Panel:  Basename 11/28/11 1642 11/28/11 0908  NA 139 138  K 2.7* 3.2*  CL 101 99  CO2 19 24  GLUCOSE 207* 132*  BUN 40* 37*  CREATININE 4.19* 4.18*  CALCIUM 9.5 9.8  MG -- --  PHOS -- --   Liver Function Tests:  Basename 11/28/11 1642  AST 24  ALT 18  ALKPHOS 263*  BILITOT 0.4  PROT 6.1  ALBUMIN 2.2*   No results found for this basename: LIPASE:2,AMYLASE:2 in the last 72 hours No results found for this basename: AMMONIA:2 in  the last 72 hours CBC:  Basename 11/28/11 1642 November 29, 2011 0951  WBC 32.0* 27.8*  NEUTROABS 30.0* 25.6*  HGB 10.2* 8.3*  HCT 30.3* 24.4*  MCV 90.2 91.4  PLT 352 297   Cardiac Enzymes: No results found for this basename: CKTOTAL:3,CKMB:3,CKMBINDEX:3,TROPONINI:3 in the last 72 hours BNP:  Basename 11/28/11 1643  PROBNP 22655.0*   D-Dimer:  Alvira Philips 11/28/11 1642  DDIMER >20.00*   CBG:  Basename 29-Nov-2011 2333  GLUCAP 106*   Hemoglobin A1C: No results found for this basename: HGBA1C in the last 72 hours Fasting Lipid Panel: No results found for this basename: CHOL,HDL,LDLCALC,TRIG,CHOLHDL,LDLDIRECT in the last 72 hours Thyroid Function Tests: No results found for this basename: TSH,T4TOTAL,FREET4,T3FREE,THYROIDAB in the last 72 hours Anemia Panel: No results found for this  basename: VITAMINB12,FOLATE,FERRITIN,TIBC,IRON,RETICCTPCT in the last 72 hours Coagulation: No results found for this basename: LABPROT:2,INR:2 in the last 72 hours Urine Drug Screen: Drugs of Abuse  No results found for this basename: labopia, cocainscrnur, labbenz, amphetmu, thcu, labbarb    Alcohol Level: No results found for this basename: ETH:2 in the last 72 hours Urinalysis:  Basename 2011-11-29 1008  COLORURINE YELLOW  LABSPEC 1.015  PHURINE 6.5  GLUCOSEU NEGATIVE  HGBUR TRACE*  BILIRUBINUR NEGATIVE  KETONESUR NEGATIVE  PROTEINUR 100*  UROBILINOGEN 0.2  NITRITE NEGATIVE  LEUKOCYTESUR LARGE*   Misc. Labs:   ABGS:  Basename 11/28/11 1525  PHART 7.346*  PO2ART 487.0*  TCO2 16.7  HCO3 17.9*     MICROBIOLOGY: Recent Results (from the past 240 hour(s))  CULTURE, BLOOD (ROUTINE X 2)     Status: Normal (Preliminary result)   Collection Time   29-Nov-2011 11:30 AM      Component Value Range Status Comment   Specimen Description BLOOD RIGHT HAND   Final    Special Requests BOTTLES DRAWN AEROBIC ONLY 6CC BOTTLE   Final    Culture NO GROWTH 1 DAY   Final    Report Status PENDING   Incomplete   CULTURE, BLOOD (ROUTINE X 2)     Status: Normal (Preliminary result)   Collection Time   11-29-2011 12:00 PM      Component Value Range Status Comment   Specimen Description BLOOD LEFT HAND   Final    Special Requests BOTTLES DRAWN AEROBIC ONLY 6CC BOTTLE   Final    Culture NO GROWTH 1 DAY   Final    Report Status PENDING   Incomplete   MRSA PCR SCREENING     Status: Abnormal   Collection Time   11-29-11  5:14 PM      Component Value Range Status Comment   MRSA by PCR POSITIVE (*) NEGATIVE  Final     Studies/Results: Dg Chest 1 View  Nov 29, 2011  *RADIOLOGY REPORT*  Clinical Data: Altered mental status  CHEST - 1 VIEW  Comparison: 02/19/2011  Findings: Left costophrenic angle is excluded from this single image.  Mild lingular scarring.  Bilateral lower lobe opacities, likely  scarring versus atelectasis.  No pneumothorax.  Mediastinal silhouette is stable.  Right sided dual lumen dialysis catheter.  IMPRESSION: No evidence of acute cardiopulmonary disease.  Mild lingular scarring.  Bilateral lower lobe opacities, likely scarring versus atelectasis.  Original Report Authenticated By: Charline Bills, M.D.   Ct Head Wo Contrast  2011/11/29  *RADIOLOGY REPORT*  Clinical Data: Altered mental status.  CT HEAD WITHOUT CONTRAST  Technique:  Contiguous axial images were obtained from the base of the skull through the vertex without contrast.  Comparison: None.  Findings: Age appropriate atrophy.  Negative for hemorrhage. Negative for acute infarct or mass.  Mild chronic microvascular ischemia in the white matter.  Calvarium is intact.  IMPRESSION: No acute intracranial abnormality.  Original Report Authenticated By: Camelia Phenes, M.D.   Dg Chest Port 1 View  11/28/2011  *RADIOLOGY REPORT*  Clinical Data: Cardiac arrest status post intubation.  PORTABLE CHEST - 1 VIEW  Comparison:  2011/11/28  Findings: Dialysis catheter tip SVC/RA junction.  Cardiomegaly. Left pleural effusion moderate sized.  Early pulmonary edema. Nasogastric tube overlies the endotracheal tube.  Endotracheal tube tip felt to lie 5.6 cm above carina.  No pneumothorax. Worsening aeration.  IMPRESSION: ET tube tip 5.6 cm above carina.  NG tube.  Cardiomegaly with early pulmonary edema.  Left pleural effusion.  Original Report Authenticated By: Elsie Stain, M.D.   Dg Knee Complete 4 Views Right  2011/11/28  *RADIOLOGY REPORT*  Clinical Data: Altered mental status  RIGHT KNEE - COMPLETE 4+ VIEW  Comparison: None.  Findings: No fracture or dislocation is seen.  Status post right total knee arthroplasty with spacer.  No evidence of hardware complication.  The visualized soft tissues are unremarkable.  No suprapatellar knee joint effusion.  IMPRESSION: No fracture or dislocation is seen.  Right total knee arthroplasty  without evidence of hardware complication.  Original Report Authenticated By: Charline Bills, M.D.    Medications:  Scheduled:   . albuterol  2.5 mg Nebulization Q4H  . amLODipine  10 mg Oral Daily  . atenolol  25 mg Oral Daily  . baclofen  10 mg Oral BID  . cefTRIAXone (ROCEPHIN)  IV  1 g Intravenous Q24H  . Chlorhexidine Gluconate Cloth  6 each Topical Q0600  . citalopram  40 mg Oral Daily  . DOPamine      . dyphylline  200 mg Oral BID  . enoxaparin (LOVENOX) injection  30 mg Subcutaneous Daily  . Fluticasone-Salmeterol  1 puff Inhalation Q12H  . linezolid  600 mg Intravenous Q12H  . loratadine  10 mg Oral Daily  . losartan  100 mg Oral Daily  . mupirocin ointment  1 application Nasal BID  . oxybutynin  5 mg Oral Daily  . potassium chloride  20 mEq Oral BID  . sodium chloride      . theophylline  200 mg Oral Daily  . vancomycin  1,000 mg Intravenous Once  . DISCONTD: enoxaparin (LOVENOX) injection  40 mg Subcutaneous Daily   Continuous:   . DOPamine 2 mcg/kg/min (11/28/11 1600)  . DISCONTD: sodium chloride 50 mL/hr at November 28, 2011 1400   AOZ:HYQMVHQ, heparin  Assesment: She had cardiac arrest and ventricular fibrillation. She has respiratory failure from that. She is intubated and on ventilator. Active Problems:  Chronic kidney failure  Essential hypertension, benign  Cardiac arrest - ventricular fibrillation    Plan: I don't think we need to treat her for seizures at this point because I do think this is more of a myoclonic response. She will definitely need neurological evaluation. I discussed her situation and prognosis with her son at length and he wishes a least temporarily to go on with no CODE BLUE order. He will discuss this with his sister who was on her way and they will make a more formal decision at that point. I told him that their decision could be changed at any time    LOS: 1 day   Ishia Tenorio L 11/28/2011, 6:28 PM

## 2011-11-28 NOTE — Progress Notes (Signed)
Notified Dr. Felecia Shelling of patient's condition.  Patient is more alert than beginning of shift, but speaks in broken sentences and is still confused.  Patient vomited small amount of brown fluid and oxygen was placed on patient.  Patient blood pressure had increased and pulse is wnl.  Patient is very diaphoretic, and cbg is 106.  Received order to place patient on telemetry and to monitor patient.

## 2011-11-28 NOTE — Progress Notes (Signed)
ANTIBIOTIC CONSULT NOTE - INITIAL  Pharmacy Consult for Vancomycin Indication: rule out UTI. R/o HD catheter infection  No Known Allergies  Patient Measurements: Height: 5\' 5"  (165.1 cm) Weight: 207 lb 0.2 oz (93.9 kg) IBW/kg (Calculated) : 57  Adjusted Body Weight:    Vital Signs: Temp: 97.5 F (36.4 C) (02/11 0549) Temp src: Axillary (02/11 0549) BP: 148/93 mmHg (02/11 0549) Pulse Rate: 70  (02/11 0549) Intake/Output from previous day:   Intake/Output from this shift:    Labs:  Basename 11/28/11 0908 2011-12-02 0951  WBC -- 27.8*  HGB -- 8.3*  PLT -- 297  LABCREA -- --  CREATININE 4.18* 3.56*   Estimated Creatinine Clearance: 12.4 ml/min (by C-G formula based on Cr of 4.18).    Microbiology: Recent Results (from the past 720 hour(s))  CULTURE, BLOOD (ROUTINE X 2)     Status: Normal (Preliminary result)   Collection Time   12/02/2011 11:30 AM      Component Value Range Status Comment   Specimen Description BLOOD RIGHT HAND   Final    Special Requests BOTTLES DRAWN AEROBIC ONLY 6CC BOTTLE   Final    Culture NO GROWTH 1 DAY   Final    Report Status PENDING   Incomplete   CULTURE, BLOOD (ROUTINE X 2)     Status: Normal (Preliminary result)   Collection Time   2011-12-02 12:00 PM      Component Value Range Status Comment   Specimen Description BLOOD LEFT HAND   Final    Special Requests BOTTLES DRAWN AEROBIC ONLY 6CC BOTTLE   Final    Culture NO GROWTH 1 DAY   Final    Report Status PENDING   Incomplete   MRSA PCR SCREENING     Status: Abnormal   Collection Time   12-02-11  5:14 PM      Component Value Range Status Comment   MRSA by PCR POSITIVE (*) NEGATIVE  Final     Medical History: Past Medical History  Diagnosis Date  . Depression   . Essential hypertension, benign   . ESRD (end stage renal disease)   . Asthma   . Anemia of chronic disease   . Lumbago   . Stage i decubitus ulcer and pressure area   . Infection of prosthetic knee joint   . Renal  insufficiency   . Failure to thrive   . Generalized weakness   . Pressure ulcer     Medications:  Scheduled:    . albuterol  2.5 mg Nebulization Q4H  . amLODipine  10 mg Oral Daily  . atenolol  25 mg Oral Daily  . baclofen  10 mg Oral BID  . cefTRIAXone (ROCEPHIN)  IV  1 g Intravenous Once  . cefTRIAXone (ROCEPHIN)  IV  1 g Intravenous Q24H  . Chlorhexidine Gluconate Cloth  6 each Topical Q0600  . citalopram  40 mg Oral Daily  . dyphylline  200 mg Oral BID  . enoxaparin (LOVENOX) injection  30 mg Subcutaneous Daily  . Fluticasone-Salmeterol  1 puff Inhalation Q12H  . loratadine  10 mg Oral Daily  . losartan  100 mg Oral Daily  . mupirocin ointment  1 application Nasal BID  . oxybutynin  5 mg Oral Daily  . potassium chloride  20 mEq Oral BID  . theophylline  200 mg Oral Daily  . vancomycin  1,000 mg Intravenous Once  . DISCONTD: enoxaparin (LOVENOX) injection  40 mg Subcutaneous Daily   Assessment:  Empiric  therapy. HD days are on Tue-Thu-Sat  Goal of Therapy:  Vancomycin trough level 10-15 mcg/ml  Plan:  Vancomycin 1000 mg IV today. Follow up HD schedule and assign more doses.  Gilman Buttner, [place] J 11/28/2011,12:12 PM

## 2011-11-28 NOTE — Progress Notes (Signed)
PT IS UNRESPONSIVE. WHEN PT MOVED FOR CXR SHE HAD BRIEF EPISODE OF POSSIBLE SEIZURE ACTIVITY. PT HAS NO BLINK RESPONSE.Marland Kitchen NO PAIN RESPONSE. BUT SHE DOES HAVE JERKING MOVEMENT WHEN BODY PART IS MOVED.

## 2011-11-28 NOTE — Progress Notes (Signed)
Spoke with pt daughter about pt drug allergies, daughter stated pt did not have any know drug allergies.

## 2011-11-28 NOTE — Progress Notes (Signed)
DRS KNOWLTON,HAWKINS AND ROTHBART ALL IN TO EXAMINE PT AND CONFERENCE W/ PT'S SON JOHN Phariss. DR [facility] CALLED FOR NEUROCONSULT.

## 2011-11-28 NOTE — Consult Note (Signed)
Reason for Consult: End-stage renal disease Referring Physician: [name] is an 78 y.o. female.  HPI: She is a patient was multiple her medical history including history of end-stage renal disease on maintenance hemodialysis Tuesday Thursday Saturday history of hypertension history of significant obesity and degenerative joint disease. Presently her she was sent from nursing home because of altered mental status. According to the note patient has been getting some pain medication for recurrent joint pain. Yesterday patient was found to be very somnolent and very difficult to wake up, hence patient was sent to the emergency room where she was admitted for altered mental status. Presently patient seems to be arousable but remained confused, moaning.  Past Medical History  Diagnosis Date  . Depression   . Essential hypertension, benign   . ESRD (end stage renal disease)   . Asthma   . Anemia of chronic disease   . Lumbago   . Stage i decubitus ulcer and pressure area   . Infection of prosthetic knee joint   . Renal insufficiency   . Failure to thrive   . Generalized weakness   . Pressure ulcer     Past Surgical History  Procedure Date  . Patellectomy   . Abdominal hysterectomy   . Replacement total knee bilateral   . Peritoneal shunt     Family History  Problem Relation Age of Onset  . Adopted: Yes    Social History:  reports that she has never smoked. She has never used smokeless tobacco. She reports that she does not drink alcohol or use illicit drugs.  Allergies:  Allergies  Allergen Reactions  . Vancomycin Cross Reactors Other (See Comments)    unknown    Medications: I have reviewed the patient's current medications.  Results for orders placed during the hospital encounter of 11/23/2011 (from the past 48 hour(s))  CBC     Status: Abnormal   Collection Time   11/18/2011  9:51 AM      Component Value Range Comment   WBC 27.8 (*) 4.0 - 10.5 (K/uL)    RBC 2.67 (*) 3.87 - 5.11 (MIL/uL)    Hemoglobin 8.3 (*) 12.0 - 15.0 (g/dL)    HCT 29.5 (*) 62.1 - 46.0 (%)    MCV 91.4  78.0 - 100.0 (fL)    MCH 31.1  26.0 - 34.0 (pg)    MCHC 34.0  30.0 - 36.0 (g/dL)    RDW 30.8  65.7 - 84.6 (%)    Platelets 297  150 - 400 (K/uL)   DIFFERENTIAL     Status: Abnormal   Collection Time   11/20/2011  9:51 AM      Component Value Range Comment   Neutrophils Relative 92 (*) 43 - 77 (%)    Lymphocytes Relative 3 (*) 12 - 46 (%)    Monocytes Relative 4  3 - 12 (%)    Eosinophils Relative 1  0 - 5 (%)    Basophils Relative 0  0 - 1 (%)    Neutro Abs 25.6 (*) 1.7 - 7.7 (K/uL)    Lymphs Abs 0.8  0.7 - 4.0 (K/uL)    Monocytes Absolute 1.1 (*) 0.1 - 1.0 (K/uL)    Eosinophils Absolute 0.3  0.0 - 0.7 (K/uL)    Basophils Absolute 0.0  0.0 - 0.1 (K/uL)    WBC Morphology WHITE COUNT CONFIRMED ON SMEAR     BASIC METABOLIC PANEL     Status: Abnormal   Collection  Time   2011-12-04  9:51 AM      Component Value Range Comment   Sodium 135  135 - 145 (mEq/L)    Potassium 3.0 (*) 3.5 - 5.1 (mEq/L)    Chloride 98  96 - 112 (mEq/L)    CO2 25  19 - 32 (mEq/L)    Glucose, Bld 123 (*) 70 - 99 (mg/dL)    BUN 27 (*) 6 - 23 (mg/dL)    Creatinine, Ser 1.61 (*) 0.50 - 1.10 (mg/dL)    Calcium 9.3  8.4 - 10.5 (mg/dL)    GFR calc non Af Amer 11 (*) >90 (mL/min)    GFR calc Af Amer 13 (*) >90 (mL/min)   URINALYSIS, ROUTINE W REFLEX MICROSCOPIC     Status: Abnormal   Collection Time   Dec 04, 2011 10:08 AM      Component Value Range Comment   Color, Urine YELLOW  YELLOW     APPearance CLOUDY (*) CLEAR     Specific Gravity, Urine 1.015  1.005 - 1.030     pH 6.5  5.0 - 8.0     Glucose, UA NEGATIVE  NEGATIVE (mg/dL)    Hgb urine dipstick TRACE (*) NEGATIVE     Bilirubin Urine NEGATIVE  NEGATIVE     Ketones, ur NEGATIVE  NEGATIVE (mg/dL)    Protein, ur 096 (*) NEGATIVE (mg/dL)    Urobilinogen, UA 0.2  0.0 - 1.0 (mg/dL)    Nitrite NEGATIVE  NEGATIVE     Leukocytes, UA LARGE (*)  NEGATIVE    URINE MICROSCOPIC-ADD ON     Status: Abnormal   Collection Time   12-04-2011 10:08 AM      Component Value Range Comment   Squamous Epithelial / LPF FEW (*) RARE     WBC, UA 21-50  <3 (WBC/hpf)    RBC / HPF 7-10  <3 (RBC/hpf)    Bacteria, UA MANY (*) RARE    CULTURE, BLOOD (ROUTINE X 2)     Status: Normal (Preliminary result)   Collection Time   2011-12-04 11:30 AM      Component Value Range Comment   Specimen Description BLOOD RIGHT HAND      Special Requests BOTTLES DRAWN AEROBIC ONLY 6CC BOTTLE      Culture PENDING      Report Status PENDING     LACTIC ACID, PLASMA     Status: Normal   Collection Time   12-04-11 12:00 PM      Component Value Range Comment   Lactic Acid, Venous 1.7  0.5 - 2.2 (mmol/L)   CULTURE, BLOOD (ROUTINE X 2)     Status: Normal (Preliminary result)   Collection Time   12-04-2011 12:00 PM      Component Value Range Comment   Specimen Description BLOOD LEFT HAND      Special Requests BOTTLES DRAWN AEROBIC ONLY 6CC BOTTLE      Culture PENDING      Report Status PENDING     MRSA PCR SCREENING     Status: Abnormal   Collection Time   2011-12-04  5:14 PM      Component Value Range Comment   MRSA by PCR POSITIVE (*) NEGATIVE    GLUCOSE, CAPILLARY     Status: Abnormal   Collection Time   12/04/2011 11:33 PM      Component Value Range Comment   Glucose-Capillary 106 (*) 70 - 99 (mg/dL)     Dg Chest 1 View  0/45/4098  *RADIOLOGY REPORT*  Clinical Data: Altered mental status  CHEST - 1 VIEW  Comparison: 02/19/2011  Findings: Left costophrenic angle is excluded from this single image.  Mild lingular scarring.  Bilateral lower lobe opacities, likely scarring versus atelectasis.  No pneumothorax.  Mediastinal silhouette is stable.  Right sided dual lumen dialysis catheter.  IMPRESSION: No evidence of acute cardiopulmonary disease.  Mild lingular scarring.  Bilateral lower lobe opacities, likely scarring versus atelectasis.  Original Report Authenticated By: Charline Bills, M.D.   Ct Head Wo Contrast  11/23/2011  *RADIOLOGY REPORT*  Clinical Data: Altered mental status.  CT HEAD WITHOUT CONTRAST  Technique:  Contiguous axial images were obtained from the base of the skull through the vertex without contrast.  Comparison: None.  Findings: Age appropriate atrophy.  Negative for hemorrhage. Negative for acute infarct or mass.  Mild chronic microvascular ischemia in the white matter.  Calvarium is intact.  IMPRESSION: No acute intracranial abnormality.  Original Report Authenticated By: Camelia Phenes, M.D.   Dg Knee Complete 4 Views Right  11/20/2011  *RADIOLOGY REPORT*  Clinical Data: Altered mental status  RIGHT KNEE - COMPLETE 4+ VIEW  Comparison: None.  Findings: No fracture or dislocation is seen.  Status post right total knee arthroplasty with spacer.  No evidence of hardware complication.  The visualized soft tissues are unremarkable.  No suprapatellar knee joint effusion.  IMPRESSION: No fracture or dislocation is seen.  Right total knee arthroplasty without evidence of hardware complication.  Original Report Authenticated By: Charline Bills, M.D.    Review of Systems  Unable to perform ROS: mental status change   Blood pressure 148/93, pulse 70, temperature 97.5 F (36.4 C), temperature source Axillary, resp. rate 20, height 5\' 5"  (1.651 m), weight 93.9 kg (207 lb 0.2 oz), SpO2 96.00%. Physical Exam  Constitutional:       Patient her as this moment arousable, very confused and continuously moaning. She doesn't seem to be in any apparent distress.  Eyes: No scleral icterus.  Neck: No JVD present.  Cardiovascular: Normal rate, regular rhythm and normal heart sounds.   No murmur heard. Respiratory: No respiratory distress. She has no wheezes.  Musculoskeletal: She exhibits no edema.    Assessment/Plan: Problem 1 end-stage renal disease she status post hemodialysis on Saturday presently her pending creatinine was in acceptable range potassium  seems to be somewhat low. Problem #2 history of altered mental status etiology as this moment not clear possibly combination of pain medications and infection. Problem #3 elevated white blood cell count. Patient has signs of urine tract infection and also she has a catheter for hemodialysis. Blood culture as this moment is pending. Problem #4 history of hypertension her blood pressure seems to be controlled very well. Initially when she came her blood pressure low but presently his present normal range. Problem #5 history of obesity Problem #6 history of anemia is secondary to chronic renal failure Problem #7 history of arthritis Problem #8 history of failure to thrive, with low albumin Plan: We'll give her KCl 10 mEq IV x2 doses as patient is very somnolent and difficult to give oral potassium if repeat potassium is still low. We'll make arrangements for patient to get dialysis tomorrow We'll check her CBC, basic metabolic panel and phosphorus in the morning If her white cell count remained high possibly we'll do repeat blood culture and adjust her antibiotics.  [name] S 11/28/2011, 8:48 AM

## 2011-11-29 ENCOUNTER — Inpatient Hospital Stay (HOSPITAL_COMMUNITY): Payer: Medicare Other

## 2011-11-29 ENCOUNTER — Inpatient Hospital Stay (HOSPITAL_COMMUNITY)
Admit: 2011-11-29 | Discharge: 2011-11-29 | Disposition: A | Discharge status: Home / Self Care | Payer: Medicare Other | Attending: Neurology | Admitting: Neurology

## 2011-11-29 DIAGNOSIS — I4901 Ventricular fibrillation: Secondary | ICD-10-CM

## 2011-11-29 DIAGNOSIS — I469 Cardiac arrest, cause unspecified: Secondary | ICD-10-CM

## 2011-11-29 LAB — BASIC METABOLIC PANEL
GFR calc Af Amer: 11 mL/min — ABNORMAL LOW (ref >90)
GFR calc non Af Amer: 9 mL/min — ABNORMAL LOW (ref >90)
Glucose, Bld: 202 mg/dL — ABNORMAL HIGH (ref 70–99)
Potassium: 3.8 mEq/L (ref 3.5–5.1)
Sodium: 136 mEq/L (ref 135–145)

## 2011-11-29 LAB — CBC
Hemoglobin: 8.9 g/dL — ABNORMAL LOW (ref 12.0–15.0)
MCHC: 33.7 g/dL (ref 30.0–36.0)
RDW: 15 % (ref 11.5–15.5)
WBC: 33.8 10*3/uL — ABNORMAL HIGH (ref 4.0–10.5)

## 2011-11-29 LAB — PHOSPHORUS: Phosphorus: 1.9 mg/dL — ABNORMAL LOW (ref 2.3–4.6)

## 2011-11-29 LAB — CARDIAC PANEL(CRET KIN+CKTOT+MB+TROPI): CK, MB: 4.8 ng/mL — ABNORMAL HIGH (ref 0.3–4.0)

## 2011-11-29 MED ORDER — LORAZEPAM 2 MG/ML IJ SOLN
0.5000 mg | Freq: Four times a day (QID) | INTRAMUSCULAR | Status: DC
  Administered 2011-11-29 – 2011-12-01 (×8): 0.5 mg via INTRAVENOUS
  Filled 2011-11-29 (×8): qty 1

## 2011-11-29 MED ORDER — LORAZEPAM 2 MG/ML IJ SOLN
0.5000 mg | INTRAMUSCULAR | Status: DC | PRN
  Administered 2011-11-29: 0.5 mg via INTRAVENOUS

## 2011-11-29 MED ORDER — POTASSIUM CHLORIDE 20 MEQ/15ML (10%) PO SOLN
ORAL | Status: AC
  Administered 2011-11-29: 20 meq
  Filled 2011-11-29: qty 30

## 2011-11-29 MED ORDER — LORAZEPAM 2 MG/ML IJ SOLN
INTRAMUSCULAR | Status: AC
  Filled 2011-11-29: qty 1

## 2011-11-29 MED ORDER — POTASSIUM CHLORIDE 20 MEQ/15ML (10%) PO LIQD
20.0000 meq | Freq: Two times a day (BID) | ORAL | Status: DC
  Administered 2011-11-29 – 2011-12-01 (×5): 20 meq via ORAL
  Filled 2011-11-29 (×5): qty 30

## 2011-11-29 MED ORDER — CHLORHEXIDINE GLUCONATE 0.12 % MT SOLN
15.0000 mL | Freq: Two times a day (BID) | OROMUCOSAL | Status: DC
  Administered 2011-11-29 – 2011-12-04 (×11): 15 mL via OROMUCOSAL
  Filled 2011-11-29 (×11): qty 15

## 2011-11-29 MED ORDER — SODIUM CHLORIDE 0.9 % IJ SOLN
INTRAMUSCULAR | Status: AC
  Administered 2011-11-29: 10 mL
  Filled 2011-11-29: qty 3

## 2011-11-29 MED ORDER — BIOTENE DRY MOUTH MT LIQD
15.0000 mL | Freq: Four times a day (QID) | OROMUCOSAL | Status: DC
  Administered 2011-11-29 – 2011-12-03 (×18): 15 mL via OROMUCOSAL

## 2011-11-29 MED ORDER — PANTOPRAZOLE SODIUM 40 MG IV SOLR
40.0000 mg | INTRAVENOUS | Status: DC
  Administered 2011-11-29 – 2011-12-02 (×4): 40 mg via INTRAVENOUS
  Filled 2011-11-29 (×4): qty 40

## 2011-11-29 MED ORDER — ARTIFICIAL TEARS OP OINT
TOPICAL_OINTMENT | OPHTHALMIC | Status: AC
  Filled 2011-11-29: qty 3.5

## 2011-11-29 MED FILL — Medication: Qty: 1 | Status: AC

## 2011-11-29 NOTE — Progress Notes (Signed)
[name], [name]              ACCOUNT NO.:  [id number]  MEDICAL RECORD NO.:  [id number]  LOCATION:  IC05                          FACILITY:  [facility]  PHYSICIAN:  Mila Homer. Sudie Bailey, M.D.DATE OF BIRTH:  03/31/1930  DATE OF PROCEDURE: DATE OF DISCHARGE:                                PROGRESS NOTE   SUBJECTIVE:  She continues in the ICU on a vent.  She is only on lorazepam IV at present, nothing else for sedation.  OBJECTIVE:  VITAL SIGNS:  Temperature is 98.5.  Pulse 74.  Respiratory rate 19 on the vent.  BP 125/30. GENERAL APPEARANCE:  She is supine.  She is not responding.  She has occasional jerks all over. HEART:  Regular rhythm; however, rate at about 70. LUNGS:  With the ventilation, her lungs are actually fairly clear.  LABORATORY DATA:  Today, her BUN is 43, creatinine 4.19.  Her white cell count 33,800, hemoglobin 8.9.  Her D-dimer was greater than 20, but her urine shows greater than 100,000 CFU of gram-negative rods.  ASSESSMENT: 1. Anoxic brain damage. 2. The patient is status post full code blue. 3. Gram-negative rod urinary tract infection. 4. Methicillin-resistant Staph aureus positive on PCR. 5. Chronic renal insufficiency on hemodialysis 3 times a week. 6. Chronic infection of the left knee, now status post 2 total knees     with removal of same.  PLAN:  I talked to her daughter at length.  Her daughter blames herself for being out of the room when the patient had what appears to be a respiratory arrest.  I also reviewed the notes from Dr. Dietrich Pates, Dr. Juanetta Gosling, and Dr. Gerilyn Pilgrim, her cardiologist, pulmonologist, and neurologist, respectively.     Mila Homer. Sudie Bailey, M.D.     SDK/MEDQ  D:  11/29/2011  T:  11/29/2011  Job:  [id number]

## 2011-11-29 NOTE — Progress Notes (Signed)
Subjective: Interval History:  Objective: Vital signs in last 24 hours: Temp:  [97.5 F (36.4 C)-98 F (36.7 C)] 97.8 F (36.6 C) (02/12 0400) Pulse Rate:  [67-94] 75  (02/12 0600) Resp:  [14-28] 19  (02/12 0600) BP: (100-144)/(17-98) 121/50 mmHg (02/12 0600) SpO2:  [95 %-100 %] 100 % (02/12 0645) FiO2 (%):  [40 %-100 %] 40 % (02/12 0751) Weight:  [93.9 kg (207 lb 0.2 oz)-98.2 kg (216 lb 7.9 oz)] 98.2 kg (216 lb 7.9 oz) (02/12 0500)  Intake/Output from previous day: 02/11 0701 - 02/12 0700 In: 2171 [I.V.:656; NG/GT:60; IV Piggyback:1455] Out: 150 [Urine:150] Intake/Output this shift:   Nutritional status: NPO    Lab Results:  [facility] 11/29/11 0437 11/28/11 1642  WBC 33.8* 32.0*  HGB 8.9* 10.2*  HCT 26.4* 30.3*  PLT 367 352  NA 136 139  K 3.8 2.7*  CL 100 101  CO2 21 19  GLUCOSE 202* 207*  BUN 43* 40*  CREATININE 4.19* 4.19*  CALCIUM 9.0 9.5  LABA1C -- --   Lipid Panel No results found for this basename: CHOL,TRIG,HDL,CHOLHDL,VLDL,LDLCALC in the last 72 hours  Studies/Results: Dg Chest 1 View  11/20/2011  *RADIOLOGY REPORT*  Clinical Data: Altered mental status  CHEST - 1 VIEW  Comparison: 02/19/2011  Findings: Left costophrenic angle is excluded from this single image.  Mild lingular scarring.  Bilateral lower lobe opacities, likely scarring versus atelectasis.  No pneumothorax.  Mediastinal silhouette is stable.  Right sided dual lumen dialysis catheter.  IMPRESSION: No evidence of acute cardiopulmonary disease.  Mild lingular scarring.  Bilateral lower lobe opacities, likely scarring versus atelectasis.  Original Report Authenticated By: Charline Bills, M.D.   Ct Head Wo Contrast  11/21/2011  *RADIOLOGY REPORT*  Clinical Data: Altered mental status.  CT HEAD WITHOUT CONTRAST  Technique:  Contiguous axial images were obtained from the base of the skull through the vertex without contrast.  Comparison: None.  Findings: Age appropriate atrophy.  Negative for  hemorrhage. Negative for acute infarct or mass.  Mild chronic microvascular ischemia in the white matter.  Calvarium is intact.  IMPRESSION: No acute intracranial abnormality.  Original Report Authenticated By: Camelia Phenes, M.D.   Dg Chest Port 1 View  11/28/2011  *RADIOLOGY REPORT*  Clinical Data: Cardiac arrest status post intubation.  PORTABLE CHEST - 1 VIEW  Comparison:  12/10/2011  Findings: Dialysis catheter tip SVC/RA junction.  Cardiomegaly. Left pleural effusion moderate sized.  Early pulmonary edema. Nasogastric tube overlies the endotracheal tube.  Endotracheal tube tip felt to lie 5.6 cm above carina.  No pneumothorax. Worsening aeration.  IMPRESSION: ET tube tip 5.6 cm above carina.  NG tube.  Cardiomegaly with early pulmonary edema.  Left pleural effusion.  Original Report Authenticated By: Elsie Stain, M.D.   Dg Knee Complete 4 Views Right  11/20/2011  *RADIOLOGY REPORT*  Clinical Data: Altered mental status  RIGHT KNEE - COMPLETE 4+ VIEW  Comparison: None.  Findings: No fracture or dislocation is seen.  Status post right total knee arthroplasty with spacer.  No evidence of hardware complication.  The visualized soft tissues are unremarkable.  No suprapatellar knee joint effusion.  IMPRESSION: No fracture or dislocation is seen.  Right total knee arthroplasty without evidence of hardware complication.  Original Report Authenticated By: Charline Bills, M.D.    Medications:  Scheduled Meds:   . albuterol  2.5 mg Nebulization Q4H  . amLODipine  10 mg Oral Daily  . antiseptic oral rinse  15 mL Mouth Rinse  QID  . artificial tears   Both Eyes Q8H  . atenolol  25 mg Oral Daily  . baclofen  10 mg Oral BID  . cefTRIAXone (ROCEPHIN)  IV  1 g Intravenous Q24H  . chlorhexidine  15 mL Mouth/Throat BID  . Chlorhexidine Gluconate Cloth  6 each Topical Q0600  . citalopram  40 mg Oral Daily  . DOPamine      . dyphylline  200 mg Oral BID  . enoxaparin (LOVENOX) injection  30 mg  Subcutaneous Daily  . Fluticasone-Salmeterol  1 puff Inhalation Q12H  . levetiracetam  1,000 mg Intravenous Once  . levetiracetam  500 mg Intravenous Q8H  . linezolid  600 mg Intravenous Q12H  . loratadine  10 mg Oral Daily  . LORazepam      . LORazepam  0.5 mg Intravenous Once  . losartan  100 mg Oral Daily  . mupirocin ointment  1 application Nasal BID  . oxybutynin  5 mg Oral Daily  . potassium chloride  10 mEq Intravenous Q1 Hr x 6  . potassium chloride  20 mEq Oral BID  . sodium chloride      . theophylline  200 mg Oral Daily  . vancomycin  1,000 mg Intravenous Once  . DISCONTD: enoxaparin (LOVENOX) injection  40 mg Subcutaneous Daily   Continuous Infusions:   . DOPamine 2 mcg/kg/min (11/29/11 0600)   PRN Meds:.heparin, heparin, LORazepam   Assessment/Plan: See dictation   LOS: 2 days   Mamye Bolds

## 2011-11-29 NOTE — Progress Notes (Signed)
*  PRELIMINARY RESULTS* Echocardiogram 2D Echocardiogram has been performed.  [name] 11/29/2011, 9:24 AM

## 2011-11-29 NOTE — Progress Notes (Signed)
INITIAL ADULT NUTRITION ASSESSMENT Date: 11/29/2011   Time: 3:00 PM Reason for Assessment: mechanical ventilation  ASSESSMENT: Female 78 y.o.  Dx: <principal problem not specified>  Hx:  Past Medical History  Diagnosis Date  . Depression   . Essential hypertension, benign   . ESRD (end stage renal disease)   . Asthma   . Anemia of chronic disease   . Lumbago   . Stage i decubitus ulcer and pressure area   . Infection of prosthetic knee joint   . Renal insufficiency   . Failure to thrive   . Generalized weakness   . Pressure ulcer    Related Meds:  Scheduled Meds:   . albuterol  2.5 mg Nebulization Q4H  . antiseptic oral rinse  15 mL Mouth Rinse QID  . artificial tears   Both Eyes Q8H  . baclofen  10 mg Oral BID  . cefTRIAXone (ROCEPHIN)  IV  1 g Intravenous Q24H  . chlorhexidine  15 mL Mouth/Throat BID  . Chlorhexidine Gluconate Cloth  6 each Topical Q0600  . citalopram  40 mg Oral Daily  . DOPamine      . dyphylline  200 mg Oral BID  . enoxaparin (LOVENOX) injection  30 mg Subcutaneous Daily  . Fluticasone-Salmeterol  1 puff Inhalation Q12H  . levetiracetam  1,000 mg Intravenous Once  . levetiracetam  500 mg Intravenous Q8H  . linezolid  600 mg Intravenous Q12H  . loratadine  10 mg Oral Daily  . LORazepam      . LORazepam  0.5 mg Intravenous Once  . LORazepam  0.5 mg Intravenous Q6H  . losartan  100 mg Oral Daily  . mupirocin ointment  1 application Nasal BID  . oxybutynin  5 mg Oral Daily  . pantoprazole (PROTONIX) IV  40 mg Intravenous Q24H  . potassium chloride  10 mEq Intravenous Q1 Hr x 6  . potassium chloride  20 mEq Oral BID  . potassium chloride      . sodium chloride      . sodium chloride      . DISCONTD: amLODipine  10 mg Oral Daily  . DISCONTD: atenolol  25 mg Oral Daily  . DISCONTD: potassium chloride  20 mEq Oral BID  . DISCONTD: theophylline  200 mg Oral Daily   Continuous Infusions:   . DOPamine 2 mcg/kg/min (11/29/11 1600)   PRN  Meds:.heparin, heparin, LORazepam  Ht: 5\' 4"  (162.6 cm)  Wt: 216 lb 7.9 oz (98.2 kg)  Ideal Wt: 54.7 kg  % Ideal Wt: 180%  Usual Wt: 200# % Usual Wt: 108%  Body mass index is 37.16 kg/(m^2).  Food/Nutrition Related Hx: Pt is a resident of [facility]. Noted weight of 200# during last hospitalization on 7/12. Pt is currently unresponsive, on vent. Per MD notes, pt with severe neurological deficits and poor prognosis. Pt DNR. Noted spiritual care consult for end of life.   Labs:  CMP     Component Value Date/Time   NA 136 11/29/2011 0437   K 3.8 11/29/2011 0437   CL 100 11/29/2011 0437   CO2 21 11/29/2011 0437   GLUCOSE 202* 11/29/2011 0437   BUN 43* 11/29/2011 0437   CREATININE 4.19* 11/29/2011 0437   CALCIUM 9.0 11/29/2011 0437   PROT 6.1 11/28/2011 1642   ALBUMIN 2.2* 11/28/2011 1642   AST 24 11/28/2011 1642   ALT 18 11/28/2011 1642   ALKPHOS 263* 11/28/2011 1642   BILITOT 0.4 11/28/2011 1642   GFRNONAA 9* 11/29/2011 0437  GFRAA 11* 11/29/2011 0437     Intake:   Intake/Output Summary (Last 24 hours) at 11/29/11 1628 Last data filed at 11/29/11 1604  Gross per 24 hour  Intake   2374 ml  Output    150 ml  Net   2224 ml    Diet Order: NPO  Supplements/Tube Feeding: none at this time  IVF:    DOPamine Last Rate: 2 mcg/kg/min (11/29/11 1600)    Estimated Nutritional Needs:   Kcal:1200-1364 kcals daily Protein:109-136 grams protein daily Fluid:1.1-1.2 L fluid daily    Enteral nutrition to provide 60-70% of estimated calorie needs (22-25 kcal/gk ideal body weight) and 100% of estimated protein needs, based on ASPEN    guidelines for permissive underfeeding in critically ill obese individuals.  NUTRITION DIAGNOSIS: -Inadequate oral intake (NI-2.1).  Status: Ongoing  RELATED TO: inability to eat  AS EVIDENCE BY: mechanical ventilation, NPO.  MONITORING/EVALUATION(Goals): 1) Pt will maintain weight of 200# 2) Pt will meet 75% of estimated energy needs  EDUCATION  NEEDS: -Education not appropriate at this time  INTERVENTION: If end of life issues are not a concern, consider enteral nutrition support. Recommend start Nepro @ 20 ml/hr and advance 10 ml/hr every 4 hours to goal rate of 30 ml/hr; regimen to provide 1296 kcals and 58 grams protein. Recommend add 30 ml Prostat TID (for additional 216 kcals and 45 grams protein). Total regimen to provide 1512 kcals (100% of estimated energy needs)  and 103 grams of protein (94% of estimated protein needs) daily at goal rate.   Dietitian #: [phone]  DOCUMENTATION CODES Per approved criteria  -Obesity Unspecified    Orlene Plum 11/29/2011, 3:00 PM

## 2011-11-29 NOTE — Progress Notes (Signed)
Inpatient Diabetes Program Recommendations  AACE/ADA: New Consensus Statement on Inpatient Glycemic Control (2009)  Target Ranges:  Prepandial:   less than 140 mg/dL      Peak postprandial:   less than 180 mg/dL (1-2 hours)      Critically ill patients:  140 - 180 mg/dL   Reason for Visit: Elevated lab glucose: 202 and 207 mg/dL  Inpatient Diabetes Program Recommendations Correction (SSI): Consider adding Novolog Correction

## 2011-11-29 NOTE — Progress Notes (Addendum)
SUBJECTIVE:Unresponsive, intubated.   Active Problems:  Cardiac arrest - ventricular fibrillation  Chronic kidney failure  Essential hypertension, benign   LABS: Basic Metabolic Panel:  Basename 11/29/11 0437 11/28/11 1642  NA 136 139  K 3.8 2.7*  CL 100 101  CO2 21 19  GLUCOSE 202* 207*  BUN 43* 40*  CREATININE 4.19* 4.19*  CALCIUM 9.0 9.5  MG -- --  PHOS 1.9* --   Liver Function Tests:  [facility] 11/28/11 1642  AST 24  ALT 18  ALKPHOS 263*  BILITOT 0.4  PROT 6.1  ALBUMIN 2.2*  CBC:  Basename 11/29/11 0437 11/28/11 1642 12/07/2011 0951  WBC 33.8* 32.0* --  NEUTROABS -- 30.0* 25.6*  HGB 8.9* 10.2* --  HCT 26.4* 30.3* --  MCV 90.1 90.2 --  PLT 367 352 --   ZOX:09,604  D-Dimer:  [facility] 11/28/11 1642  DDIMER >20.00*    RADIOLOGY: Dg Chest Port 1 View  11/28/2011  *RADIOLOGY REPORT*  Clinical Data: Cardiac arrest status post intubation.  PORTABLE CHEST - 1 VIEW  Comparison:  11/24/2011  Findings: Dialysis catheter tip SVC/RA junction.  Cardiomegaly. Left pleural effusion moderate sized.  Early pulmonary edema. Nasogastric tube overlies the endotracheal tube.  Endotracheal tube tip felt to lie 5.6 cm above carina.  No pneumothorax. Worsening aeration.  IMPRESSION: ET tube tip 5.6 cm above carina.  NG tube.  Cardiomegaly with early pulmonary edema.  Left pleural effusion.  Original Report Authenticated By: Elsie Stain, M.D.   Dg Knee Complete 4 Views Right  11/22/2011  *RADIOLOGY REPORT*  Clinical Data: Altered mental status  RIGHT KNEE - COMPLETE 4+ VIEW  Comparison: None.  Findings: No fracture or dislocation is seen.  Status post right total knee arthroplasty with spacer.  No evidence of hardware complication.  The visualized soft tissues are unremarkable.  No suprapatellar knee joint effusion.  IMPRESSION: No fracture or dislocation is seen.  Right total knee arthroplasty without evidence of hardware complication.  Original Report Authenticated By: Charline Bills, M.D.     PHYSICAL EXAM BP 121/50  Pulse 75  Temp(Src) 97.8 F (36.6 C) (Axillary)  Resp 19  Ht 5\' 4"  (1.626 m)  Wt 216 lb 7.9 oz (98.2 kg)  BMI 37.16 kg/m2  SpO2 100% General: Well developed, well nourished, in no acute distress. Intubated Head: Eyes PERRLA, No xanthomas.   Normal cephalic and atramatic  Lungs: Clear bilaterally to auscultation and percussion. Heart: HRRR S1 S2, No MRG .  Pulses are 2+ & equal.            No carotid bruit. No JVD.  No abdominal bruits. No femoral bruits. Abdomen: Bowel sounds are positive, abdomen soft and non-tender without masses or                  Hernia's noted. Msk: Myoclonic jerks Extremities: No clubbing, cyanosis or edema.  DP +1 Neuro:Unrepsonsive, several myoclonic jerking.  TELEMETRY: Reviewed telemetry pt in: NSR septal T-wave abnormality.  A few brief episodes of VT, most of which are artifact.  At least 1 true 4 beat run.  ASSESSMENT AND PLAN:  1. Status post ventricular fibrillation cardiac arrest: He was started on reviewing labs, postarrest and her potassium was 2.7.  Hypokalemia may have contributed to risk for arrhythmia; however, she also has multiple medical problems that could also have led to this event. Currently, blood pressures maintained on dopamine drip. EKG reveals no acute ischemia, despite cardiac arrest. Prognosis is poor. She remains intubated. Echo  is being completed today.  2. Anoxic brain injury: She is being followed by Dr.[place], neurologist. He  plans EEG today. She isexperiencing myoclonus and is currently on Keppra and Ativan.  3 ESRD: She is being followed by [facility]. No plan dialysis today.  Bettey Mare. Lyman Bishop NP [facility] 11/29/2011, 8:20 AM   Cardiology Attending Patient interviewed and examined. Discussed with Joni Reining, NP.  Above note annotated and modified based upon my findings.  No significant change in mental status.  Hemodynamics are good on low dose dopamine.   Neurologic evaluation in progress.  Echocardiogram pending.  Minimal urine output with stable BUN/Creat.  Cardiac markers pending.  No important arrhythmias since intubation/ventilation, suggesting that initial event may have been respiratory.  Zortman Bing, MD 11/29/2011, 10:31 AM

## 2011-11-29 NOTE — Progress Notes (Signed)
EEG completed at bedside 

## 2011-11-29 NOTE — Progress Notes (Signed)
Subjective: Interval History: none. Patient presently intubated and unresponsive her. Patient had the continuous on and off myoclonic jerk. She is status post her cardiopulmonary arrest   Objective: Vital signs in last 24 hours: Temp:  [97.5 F (36.4 C)-98 F (36.7 C)] 97.8 F (36.6 C) (02/12 0400) Pulse Rate:  [67-94] 75  (02/12 0600) Resp:  [14-28] 19  (02/12 0600) BP: (100-144)/(17-98) 121/50 mmHg (02/12 0600) SpO2:  [95 %-100 %] 100 % (02/12 0645) FiO2 (%):  [40 %-100 %] 40 % (02/12 0645) Weight:  [93.9 kg (207 lb 0.2 oz)-98.2 kg (216 lb 7.9 oz)] 98.2 kg (216 lb 7.9 oz) (02/12 0500) Weight change: 0 kg (0 lb)  Intake/Output from previous day: 02/11 0701 - 02/12 0700 In: 2171 [I.V.:656; NG/GT:60; IV Piggyback:1455] Out: 150 [Urine:150] Intake/Output this shift: Patient her and unresponsive her. Chest she has inspiratory crackles Heart exam revealed regular rate and rhythm negative murmur Abdomen positive bowel sounds Extremities no edema.  Lab Results:  [facility] 11/29/11 0437 11/28/11 1642  WBC 33.8* 32.0*  HGB 8.9* 10.2*  HCT 26.4* 30.3*  PLT 367 352   BMET:  Basename 11/29/11 0437 11/28/11 1642  NA 136 139  K 3.8 2.7*  CL 100 101  CO2 21 19  GLUCOSE 202* 207*  BUN 43* 40*  CREATININE 4.19* 4.19*  CALCIUM 9.0 9.5   No results found for this basename: PTH:2 in the last 72 hours Iron Studies: No results found for this basename: IRON,TIBC,TRANSFERRIN,FERRITIN in the last 72 hours  Studies/Results: Dg Chest 1 View  11/21/2011  *RADIOLOGY REPORT*  Clinical Data: Altered mental status  CHEST - 1 VIEW  Comparison: 02/19/2011  Findings: Left costophrenic angle is excluded from this single image.  Mild lingular scarring.  Bilateral lower lobe opacities, likely scarring versus atelectasis.  No pneumothorax.  Mediastinal silhouette is stable.  Right sided dual lumen dialysis catheter.  IMPRESSION: No evidence of acute cardiopulmonary disease.  Mild lingular scarring.   Bilateral lower lobe opacities, likely scarring versus atelectasis.  Original Report Authenticated By: Charline Bills, M.D.   Ct Head Wo Contrast  11/25/2011  *RADIOLOGY REPORT*  Clinical Data: Altered mental status.  CT HEAD WITHOUT CONTRAST  Technique:  Contiguous axial images were obtained from the base of the skull through the vertex without contrast.  Comparison: None.  Findings: Age appropriate atrophy.  Negative for hemorrhage. Negative for acute infarct or mass.  Mild chronic microvascular ischemia in the white matter.  Calvarium is intact.  IMPRESSION: No acute intracranial abnormality.  Original Report Authenticated By: Camelia Phenes, M.D.   Dg Chest Port 1 View  11/28/2011  *RADIOLOGY REPORT*  Clinical Data: Cardiac arrest status post intubation.  PORTABLE CHEST - 1 VIEW  Comparison:  11/26/2011  Findings: Dialysis catheter tip SVC/RA junction.  Cardiomegaly. Left pleural effusion moderate sized.  Early pulmonary edema. Nasogastric tube overlies the endotracheal tube.  Endotracheal tube tip felt to lie 5.6 cm above carina.  No pneumothorax. Worsening aeration.  IMPRESSION: ET tube tip 5.6 cm above carina.  NG tube.  Cardiomegaly with early pulmonary edema.  Left pleural effusion.  Original Report Authenticated By: Elsie Stain, M.D.   Dg Knee Complete 4 Views Right  12/10/2011  *RADIOLOGY REPORT*  Clinical Data: Altered mental status  RIGHT KNEE - COMPLETE 4+ VIEW  Comparison: None.  Findings: No fracture or dislocation is seen.  Status post right total knee arthroplasty with spacer.  No evidence of hardware complication.  The visualized soft tissues are unremarkable.  No suprapatellar knee joint effusion.  IMPRESSION: No fracture or dislocation is seen.  Right total knee arthroplasty without evidence of hardware complication.  Original Report Authenticated By: Charline Bills, M.D.    I have reviewed the patient's current medications.  Assessment/Plan:  problem #1 end-stage renal  disease she is status post hemodialysis on Saturday and BUN and creatinine still seems to be reasonable. Problem #2 history of her sepsis at this moment etiology not clear but seems to be her secondary to urinary tract infection. Since patient has also hemodialysis catheter possibly may contribute. Presently she is on antibiotics she is a febrile, hypotensive and with worsening of her white cell count. Problem #3 history of altered mental status at this moment seems to be multifactorial. Problem #4 history of cardiac arrest Problem #5 history of obesity Problem #6 history of anemia her H&H has this moment seems to be low.  Problem #7 history of hypokalemia potassium has corrected. Plan: We'll hold her hemodialysis today           We'll do blood culture from the line.            If patient becomes more stable probably would consider dialyzing for tomorrow. Overall her prognosis seems to be very poor.   LOS: 2 days   Ngoc Daughtridge S 11/29/2011,7:41 AM

## 2011-11-29 NOTE — Progress Notes (Signed)
NAME:  [name], [name]              ACCOUNT NO.:  [id number]  MEDICAL RECORD NO.:  [id number]  LOCATION:  IC05                          FACILITY:  [facility]  PHYSICIAN:  Rashelle Ireland A. Gerilyn Pilgrim, M.D. DATE OF BIRTH:  Nov 06, 1929  DATE OF PROCEDURE: DATE OF DISCHARGE:                                PROGRESS NOTE   The patient has remained unchanged.  In fact, she appears to be having more of these myoclonic jerks happening more spontaneously and also with tactile and painful stimuli.  Daughters are here during the evaluation of reports as the patient was having lot of leg pain on the left side. She was given hydrocodone and muscle relaxers and subsequent to that became drowsy and unresponsive and was taken to the emergency room.  PHYSICAL EXAMINATION:  GENERAL:  Continued to be intubated. NECK:  Supple. HEAD:  Normocephalic, atraumatic. ABDOMEN:  Soft. EXTREMITIES:  Pitting edema especially in the upper extremities. MENTATION:  Eyes open spontaneously, they open partially but opens fully when she has the spontaneous myoclonic jerk.  She is having them more frequently than she was on last time when I saw her.  Again, she does not attends, focuses tracks or follow commands.  Cranial evaluation shows that pupils are actually more reactivity than they were last night.  They are 5 mm but reactive.  She does have good cornea reflexes. I cannot again get oculocephalic reflexes.  She does have a gag reflex which seems improved.  Again, she has somewhat semi-purposeful  movement to painful stimuli in left upper extremity.  ASSESSMENT AND PLAN:  Severe encephalopathy, likely multifactorial including sepsis and anoxic brain injury.  She is having these frequent myoclonic activity suggestive of anoxic-induced seizures.  I am going to go ahead and add Ativan to help treat these events as they have gotten worse overnight.  We will start her on 0.5 mg every 6 hours.  We will continue with the Keppra.  EEG is  pending.  We will continue to follow the patient.  I did discuss the case at length with the family. Continue to follow the patient and see how she does.     Damaris Abeln A. Gerilyn Pilgrim, M.D.     KAD/MEDQ  D:  11/29/2011  T:  11/29/2011  Job:  [id number]

## 2011-11-29 NOTE — Progress Notes (Signed)
[name], [name]              ACCOUNT NO.:  [id number]  MEDICAL RECORD NO.:  [id number]  LOCATION:                                 FACILITY:  PHYSICIAN:  Mila Homer. Sudie Bailey, M.D.DATE OF BIRTH:  [birth year]  DATE OF PROCEDURE: DATE OF DISCHARGE:                                PROGRESS NOTE   SUBJECTIVE:  Patient is now in the ICU.  Nursing tells me that she has not had to be treated with any drugs to sedate her.  She really has not been moving.  She is on the vent.  OBJECTIVE:  Temperature is 97.5.  Pulse 77.  Respiratory rate 15.  Blood pressure 125/31.  She is currently on a vent.  She appears to be wide eyed.  She is not blinking.  Her pupils are dilated. HEART:  Regular rhythm. LUNGS:  Clear.  ASSESSMENT: 1. Status post respiratory arrest with possible anoxic encephalopathy. 2. Chronic renal insufficiency.  PLAN:  I discussed the case with Dr. [place] Bing, cardiologist, and earlier with Dr. Juanetta Gosling, pulmonologist.  I also talked about her with her son tonight.  We will have a neurologist see her tomorrow. Presently, she should be a DNR.  We will continue with other treatments.     Mila Homer. Sudie Bailey, M.D.     SDK/MEDQ  D:  11/28/2011  T:  11/28/2011  Job:  [id number]

## 2011-11-29 NOTE — Progress Notes (Signed)
Subjective: She remains intubated and on mechanical ventilation. She is unresponsive. She has myoclonic jerks with any sort of stimulation. She is hemodynamically stable at this point.  Objective: Vital signs in last 24 hours: Temp:  [97.5 F (36.4 C)-98 F (36.7 C)] 97.8 F (36.6 C) (02/12 0400) Pulse Rate:  [67-94] 75  (02/12 0600) Resp:  [14-28] 19  (02/12 0600) BP: (100-144)/(17-98) 121/50 mmHg (02/12 0600) SpO2:  [95 %-100 %] 100 % (02/12 0645) FiO2 (%):  [40 %-100 %] 40 % (02/12 0751) Weight:  [93.9 kg (207 lb 0.2 oz)-98.2 kg (216 lb 7.9 oz)] 98.2 kg (216 lb 7.9 oz) (02/12 0500) Weight change: 0 kg (0 lb) Last BM Date:  (unknown)  Intake/Output from previous day: 02/11 0701 - 02/12 0700 In: 2171 [I.V.:656; NG/GT:60; IV Piggyback:1455] Out: 150 [Urine:150]  PHYSICAL EXAM General appearance: no distress and Intubated sedated with episodic myoclonic jerks Resp: rhonchi bilaterally Cardio: regular rate and rhythm, S1, S2 normal, no murmur, click, rub or gallop GI: soft, non-tender; bowel sounds normal; no masses,  no organomegaly Extremities: extremities normal, atraumatic, no cyanosis or edema  Lab Results:    Basic Metabolic Panel:  Basename 11/29/11 0437 11/28/11 1642  NA 136 139  K 3.8 2.7*  CL 100 101  CO2 21 19  GLUCOSE 202* 207*  BUN 43* 40*  CREATININE 4.19* 4.19*  CALCIUM 9.0 9.5  MG -- --  PHOS 1.9* --   Liver Function Tests:  Basename 11/28/11 1642  AST 24  ALT 18  ALKPHOS 263*  BILITOT 0.4  PROT 6.1  ALBUMIN 2.2*   No results found for this basename: LIPASE:2,AMYLASE:2 in the last 72 hours No results found for this basename: AMMONIA:2 in the last 72 hours CBC:  Basename 11/29/11 0437 11/28/11 1642 11/25/2011 0951  WBC 33.8* 32.0* --  NEUTROABS -- 30.0* 25.6*  HGB 8.9* 10.2* --  HCT 26.4* 30.3* --  MCV 90.1 90.2 --  PLT 367 352 --   Cardiac Enzymes: No results found for this basename: CKTOTAL:3,CKMB:3,CKMBINDEX:3,TROPONINI:3 in the  last 72 hours BNP:  [facility] 11/28/11 1643  PROBNP 22655.0*   D-Dimer:  Alvira Philips 11/28/11 1642  DDIMER >20.00*   CBG:  Basename 11/28/11 2251 12/14/2011 2333  GLUCAP 144* 106*   Hemoglobin A1C: No results found for this basename: HGBA1C in the last 72 hours Fasting Lipid Panel: No results found for this basename: CHOL,HDL,LDLCALC,TRIG,CHOLHDL,LDLDIRECT in the last 72 hours Thyroid Function Tests: No results found for this basename: TSH,T4TOTAL,FREET4,T3FREE,THYROIDAB in the last 72 hours Anemia Panel: No results found for this basename: VITAMINB12,FOLATE,FERRITIN,TIBC,IRON,RETICCTPCT in the last 72 hours Coagulation: No results found for this basename: LABPROT:2,INR:2 in the last 72 hours Urine Drug Screen: Drugs of Abuse  No results found for this basename: labopia, cocainscrnur, labbenz, amphetmu, thcu, labbarb    Alcohol Level: No results found for this basename: ETH:2 in the last 72 hours Urinalysis:  Basename 12/06/2011 1008  COLORURINE YELLOW  LABSPEC 1.015  PHURINE 6.5  GLUCOSEU NEGATIVE  HGBUR TRACE*  BILIRUBINUR NEGATIVE  KETONESUR NEGATIVE  PROTEINUR 100*  UROBILINOGEN 0.2  NITRITE NEGATIVE  LEUKOCYTESUR LARGE*   Misc. Labs:  ABGS  Basename 11/28/11 1525  PHART 7.346*  PO2ART 487.0*  TCO2 16.7  HCO3 17.9*   CULTURES Recent Results (from the past 240 hour(s))  URINE CULTURE     Status: Normal (Preliminary result)   Collection Time   11/23/2011 10:08 AM      Component Value Range Status Comment   Specimen Description URINE,  CATHETERIZED   Final    Special Requests NONE   Final    Culture  Setup Time 2011/12/06   Final    Colony Count >=100,000 COLONIES/ML   Final    Culture GRAM NEGATIVE RODS   Final    Report Status PENDING   Incomplete   CULTURE, BLOOD (ROUTINE X 2)     Status: Normal (Preliminary result)   Collection Time   12-06-2011 11:30 AM      Component Value Range Status Comment   Specimen Description BLOOD RIGHT HAND   Final     Special Requests BOTTLES DRAWN AEROBIC ONLY 6CC BOTTLE   Final    Culture NO GROWTH 1 DAY   Final    Report Status PENDING   Incomplete   CULTURE, BLOOD (ROUTINE X 2)     Status: Normal (Preliminary result)   Collection Time   2011-12-06 12:00 PM      Component Value Range Status Comment   Specimen Description BLOOD LEFT HAND   Final    Special Requests BOTTLES DRAWN AEROBIC ONLY 6CC BOTTLE   Final    Culture NO GROWTH 1 DAY   Final    Report Status PENDING   Incomplete   MRSA PCR SCREENING     Status: Abnormal   Collection Time   December 06, 2011  5:14 PM      Component Value Range Status Comment   MRSA by PCR POSITIVE (*) NEGATIVE  Final    Studies/Results: Dg Chest 1 View  12/06/11  *RADIOLOGY REPORT*  Clinical Data: Altered mental status  CHEST - 1 VIEW  Comparison: 02/19/2011  Findings: Left costophrenic angle is excluded from this single image.  Mild lingular scarring.  Bilateral lower lobe opacities, likely scarring versus atelectasis.  No pneumothorax.  Mediastinal silhouette is stable.  Right sided dual lumen dialysis catheter.  IMPRESSION: No evidence of acute cardiopulmonary disease.  Mild lingular scarring.  Bilateral lower lobe opacities, likely scarring versus atelectasis.  Original Report Authenticated By: Charline Bills, M.D.   Ct Head Wo Contrast  2011/12/06  *RADIOLOGY REPORT*  Clinical Data: Altered mental status.  CT HEAD WITHOUT CONTRAST  Technique:  Contiguous axial images were obtained from the base of the skull through the vertex without contrast.  Comparison: None.  Findings: Age appropriate atrophy.  Negative for hemorrhage. Negative for acute infarct or mass.  Mild chronic microvascular ischemia in the white matter.  Calvarium is intact.  IMPRESSION: No acute intracranial abnormality.  Original Report Authenticated By: Camelia Phenes, M.D.   Dg Chest Port 1 View  11/28/2011  *RADIOLOGY REPORT*  Clinical Data: Cardiac arrest status post intubation.  PORTABLE CHEST - 1 VIEW   Comparison:  2011/12/06  Findings: Dialysis catheter tip SVC/RA junction.  Cardiomegaly. Left pleural effusion moderate sized.  Early pulmonary edema. Nasogastric tube overlies the endotracheal tube.  Endotracheal tube tip felt to lie 5.6 cm above carina.  No pneumothorax. Worsening aeration.  IMPRESSION: ET tube tip 5.6 cm above carina.  NG tube.  Cardiomegaly with early pulmonary edema.  Left pleural effusion.  Original Report Authenticated By: Elsie Stain, M.D.   Dg Knee Complete 4 Views Right  2011-12-06  *RADIOLOGY REPORT*  Clinical Data: Altered mental status  RIGHT KNEE - COMPLETE 4+ VIEW  Comparison: None.  Findings: No fracture or dislocation is seen.  Status post right total knee arthroplasty with spacer.  No evidence of hardware complication.  The visualized soft tissues are unremarkable.  No suprapatellar knee joint effusion.  IMPRESSION: No fracture or dislocation is seen.  Right total knee arthroplasty without evidence of hardware complication.  Original Report Authenticated By: Charline Bills, M.D.    Medications:  Prior to Admission:  Prescriptions prior to admission  Medication Sig Dispense Refill  . acetaminophen (TYLENOL) 325 MG tablet Take 650 mg by mouth every 4 (four) hours as needed. For pain/fever      . amLODipine (NORVASC) 10 MG tablet Take 1 tablet (10 mg total) by mouth daily.  30 tablet  11  . atenolol (TENORMIN) 25 MG tablet Take 25 mg by mouth daily.        . baclofen (LIORESAL) 10 MG tablet Take 10 mg by mouth 2 (two) times daily.      . citalopram (CELEXA) 40 MG tablet Take 40 mg by mouth daily.        . diphenhydrAMINE (BENADRYL) 25 mg capsule Take 25 mg by mouth every 4 (four) hours as needed. For itching      . Fluticasone-Salmeterol (ADVAIR DISKUS) 100-50 MCG/DOSE AEPB Inhale 1 puff into the lungs every 12 (twelve) hours. Cough and Wheezing       . HYDROcodone-acetaminophen (NORCO) 5-325 MG per tablet Take 2 tablets by mouth every 6 (six) hours as needed.  For severe pain      . Ibuprofen (ADVIL) 200 MG CAPS Take 200 mg by mouth every 12 (twelve) hours as needed. For migraine      . losartan (COZAAR) 100 MG tablet Take 100 mg by mouth daily.       Marland Kitchen oxybutynin (DITROPAN) 5 MG tablet Take 5 mg by mouth daily.        . promethazine (PHENERGAN) 12.5 MG tablet Take 12.5 mg by mouth every 6 (six) hours as needed. For vomiting      . theophylline (THEO-24) 200 MG 24 hr capsule Take 200 mg by mouth daily.      Marland Kitchen zolpidem (AMBIEN) 5 MG tablet Take 5 mg by mouth at bedtime as needed. For sleep      . albuterol (PROVENTIL HFA;VENTOLIN HFA) 108 (90 BASE) MCG/ACT inhaler Inhale 2 puffs into the lungs every 12 (twelve) hours as needed. Cough and Wheezing      . loratadine (CLARITIN) 10 MG tablet Take 10 mg by mouth daily as needed. For allergies       Scheduled:   . albuterol  2.5 mg Nebulization Q4H  . amLODipine  10 mg Oral Daily  . antiseptic oral rinse  15 mL Mouth Rinse QID  . artificial tears   Both Eyes Q8H  . atenolol  25 mg Oral Daily  . baclofen  10 mg Oral BID  . cefTRIAXone (ROCEPHIN)  IV  1 g Intravenous Q24H  . chlorhexidine  15 mL Mouth/Throat BID  . Chlorhexidine Gluconate Cloth  6 each Topical Q0600  . citalopram  40 mg Oral Daily  . DOPamine      . dyphylline  200 mg Oral BID  . enoxaparin (LOVENOX) injection  30 mg Subcutaneous Daily  . Fluticasone-Salmeterol  1 puff Inhalation Q12H  . levetiracetam  1,000 mg Intravenous Once  . levetiracetam  500 mg Intravenous Q8H  . linezolid  600 mg Intravenous Q12H  . loratadine  10 mg Oral Daily  . LORazepam      . LORazepam  0.5 mg Intravenous Once  . losartan  100 mg Oral Daily  . mupirocin ointment  1 application Nasal BID  . oxybutynin  5 mg Oral Daily  .  potassium chloride  10 mEq Intravenous Q1 Hr x 6  . potassium chloride  20 mEq Oral BID  . sodium chloride      . theophylline  200 mg Oral Daily  . vancomycin  1,000 mg Intravenous Once  . DISCONTD: enoxaparin (LOVENOX)  injection  40 mg Subcutaneous Daily   Continuous:   . DOPamine 2 mcg/kg/min (11/29/11 0600)    Assesment: She has acute respiratory failure status post cardiopulmonary arrest with ventricular fibrillation. With the episodes of myoclonic jerking her unresponsiveness etc. I'm concerned that she's had anoxic brain damage but it is early post resuscitation. She is hemodynamically stable. She is doing okay on the ventilator. Active Problems:  Chronic kidney failure  Essential hypertension, benign  Cardiac arrest - ventricular fibrillation    Plan: Continue with ventilator support. She will have neurological consultation today. She has been seen by the cardiologist and nephrologist as well.    LOS: 2 days   Wilmina Maxham L 11/29/2011, 8:05 AM

## 2011-11-30 ENCOUNTER — Inpatient Hospital Stay (HOSPITAL_COMMUNITY): Payer: Medicare Other

## 2011-11-30 LAB — BASIC METABOLIC PANEL
BUN: 49 mg/dL — ABNORMAL HIGH (ref 6–23)
CO2: 20 mEq/L (ref 19–32)
Chloride: 104 mEq/L (ref 96–112)
GFR calc non Af Amer: 9 mL/min — ABNORMAL LOW (ref >90)
Glucose, Bld: 153 mg/dL — ABNORMAL HIGH (ref 70–99)
Potassium: 4.9 mEq/L (ref 3.5–5.1)

## 2011-11-30 LAB — CBC
HCT: 24.6 % — ABNORMAL LOW (ref 36.0–46.0)
Hemoglobin: 8.3 g/dL — ABNORMAL LOW (ref 12.0–15.0)
MCH: 30.5 pg (ref 26.0–34.0)
MCHC: 33.7 g/dL (ref 30.0–36.0)
MCV: 90.4 fL (ref 78.0–100.0)
RBC: 2.72 MIL/uL — ABNORMAL LOW (ref 3.87–5.11)

## 2011-11-30 LAB — BLOOD GAS, ARTERIAL
Acid-base deficit: 2.5 mmol/L — ABNORMAL HIGH (ref 0.0–2.0)
Drawn by: 22223
FIO2: 40 %
MECHVT: 500 mL
O2 Saturation: 95.5 %
RATE: 15 resp/min
TCO2: 19.5 mmol/L (ref 0–100)
pH, Arterial: 7.461 — ABNORMAL HIGH (ref 7.350–7.400)

## 2011-11-30 LAB — CARDIAC PANEL(CRET KIN+CKTOT+MB+TROPI): Relative Index: INVALID (ref 0.0–2.5)

## 2011-11-30 MED ORDER — EPOETIN ALFA 10000 UNIT/ML IJ SOLN
10000.0000 [IU] | INTRAMUSCULAR | Status: DC
  Administered 2011-11-30: 10000 [IU] via INTRAVENOUS
  Filled 2011-11-30: qty 1

## 2011-11-30 NOTE — Progress Notes (Signed)
[name], [name]              ACCOUNT NO.:  [id number]  MEDICAL RECORD NO.:  [id number]  LOCATION:  IC05                          FACILITY:  [facility]  PHYSICIAN:  Mila Homer. Sudie Bailey, M.D.DATE OF BIRTH:  11-28-1929  DATE OF PROCEDURE:  11/30/2011 DATE OF DISCHARGE:                                PROGRESS NOTE   SUBJECTIVE:  She is about the same.  She is still in the ICU.  This is now several days after her cardiopulmonary collapse.  She is on IV fluids and on the vent, but on no sedation.  OBJECTIVE:  GENERAL:  She is supine in bed. VITAL SIGNS:  Temperature is 100 degrees, pulse 72, respiratory rate 16, blood pressure 141/37.  Her color is good.  There are no intercostal retractions. HEART:  Regular rhythm, rate of about 70. LUNGS:  Apparently are clear throughout. ABDOMEN:  Soft.  She is currently getting dialysis.  LABORATORY DATA:  BUN 49, creatinine 4.44.  Blood gases today showed a pH 7.46 with a PCO2 of 29, pO2 67 and a BNP was 22,655.  Her white cell count 29,000 down from about 33,000 yesterday.  Hemoglobin 8.3.  DIAGNOSTICS:  Chest x-ray showed a left pleural effusion.  ASSESSMENT: 1. Anoxic encephalopathy, status post cardiopulmonary arrest. 2. Urinary tract infection from Providencia. 3. Methicillin-resistant Staph aureus positive on PCR. 4. Chronic renal insufficiency, currently on hemodialysis 3 times a     week. 5. Chronic infection of left knee now status post 2 total knees with     removal of same.  PLAN:  We will continue her on the vent and treating infection.  We will start ceftriaxone for the urinary tract infection.  I have reviewed the notes from Dr. Gerilyn Pilgrim, Neurologist.  Her prognosis is still very poor.     Mila Homer. Sudie Bailey, M.D.     SDK/MEDQ  D:  11/30/2011  T:  11/30/2011  Job:  [id number]

## 2011-11-30 NOTE — Progress Notes (Signed)
NAME:  [name], [name]              ACCOUNT NO.:  [id number]  MEDICAL RECORD NO.:  [id number]  LOCATION:  IC05                          FACILITY:  [facility]  PHYSICIAN:  Charo Philipp A. Gerilyn Pilgrim, M.D. DATE OF BIRTH:  1930-10-10  DATE OF PROCEDURE: DATE OF DISCHARGE:                                PROGRESS NOTE   It appears that the myoclonic activity have subsided and essentially has stopped.  Nurses have not reported any overnight events this morning.  Examination seems about the same, although I saw she is lying in bed, eyes partially closed, does open her eyes to deep painful stimuli.  She stares ahead and does not focus or tracks, does not follow commands. Neck is supple.  Head is normocephalic.  Abdomen is soft.  Extremities, significant edema of upper extremities, especially the right side. Cranial nerve evaluation, right pupil is 4, left is 4.5.  They are sluggishly reactive today.  Corneal reflexes are intact.  Oculocephalic reflexes are absent.  Again, she does have diminished gag reflex. Facial muscle strength is symmetric.  Motor examination, again she does seem to have semi purposeful withdrawal, left upper extremity, right side.  No movements. Again I did not see any myoclonic activities today. EEG shows some abnormality showing quite low electrical voltage and somewhat of a burst suppression pattern, however, there is no epileptiform activity and no profound slowing as I had expected.  ASSESSMENT:  Significant encephalopathy, status post cardiopulmonary arrest.  The EEG does show some abnormalities but not the profound slowing as it is assisted with profound anoxic brain damage.  I suspect that the patient's encephalopathy is multifactorial.  Prognosis is still poor given her overall multiple medical comorbidities and medical problems.  Urine culture is positive for Providencia stuartii sensitive to ceftriaxone but resistant to multiple other drugs.  I did have a lengthy  discussion with the family.  I think we will continue with the current support and care for the patient.  I will continue to re- evaluate the situation and repeat her neurological evaluation for the next 2-3 days.     Breydon Senters A. Gerilyn Pilgrim, M.D.     KAD/MEDQ  D:  11/30/2011  T:  11/30/2011  Job:  [id number]

## 2011-11-30 NOTE — Progress Notes (Signed)
Subjective: She remains unresponsive. She opens her eyes periodically. Her myoclonic jerks have reduced dramatically and I did not see any while I was in the room. I had another long discussion with her family yesterday and told them that I was concerned that she had had a severe brain injury and they understood that. They told me that she would not like to remain in a prolonged vegetative state and that she was in fact unhappy with how she was prior to this  episode. They do understand that she had EEG and has neurological consultation and that we typically wait for a few days to see if she gets to recovery or not  Objective: Vital signs in last 24 hours: Temp:  [98.2 F (36.8 C)-100.1 F (37.8 C)] 100.1 F (37.8 C) (02/13 0400) Pulse Rate:  [60-98] 78  (02/13 0545) Resp:  [12-20] 19  (02/13 0545) BP: (115-146)/(29-127) 138/37 mmHg (02/13 0545) SpO2:  [3 %-100 %] 100 % (02/13 0655) FiO2 (%):  [39.7 %-50 %] 40 % (02/13 0655) Weight:  [98.7 kg (217 lb 9.5 oz)] 98.7 kg (217 lb 9.5 oz) (02/13 0515) Weight change: 4.8 kg (10 lb 9.3 oz) Last BM Date: 11/28/11  Intake/Output from previous day: 02/12 0701 - 02/13 0700 In: 1287 [I.V.:77; NG/GT:60; IV Piggyback:870] Out: 300 [Urine:300]  PHYSICAL EXAM General appearance: Unresponsive with occasional eye opening Resp: rhonchi bilaterally Cardio: regular rate and rhythm, S1, S2 normal, no murmur, click, rub or gallop GI: soft, non-tender; bowel sounds normal; no masses,  no organomegaly Extremities: extremities normal, atraumatic, no cyanosis or edema  Lab Results:    Basic Metabolic Panel:  Basename 11/30/11 0503 11/29/11 0437  NA 135 136  K 4.9 3.8  CL 104 100  CO2 20 21  GLUCOSE 153* 202*  BUN 49* 43*  CREATININE 4.44* 4.19*  CALCIUM 9.1 9.0  MG -- --  PHOS -- 1.9*   Liver Function Tests:  Basename 11/28/11 1642  AST 24  ALT 18  ALKPHOS 263*  BILITOT 0.4  PROT 6.1  ALBUMIN 2.2*   No results found for this basename:  LIPASE:2,AMYLASE:2 in the last 72 hours No results found for this basename: AMMONIA:2 in the last 72 hours CBC:  Basename 11/30/11 0503 11/29/11 0437 11/28/11 1642 12/14/11 0951  WBC 29.0* 33.8* -- --  NEUTROABS -- -- 30.0* 25.6*  HGB 8.3* 8.9* -- --  HCT 24.6* 26.4* -- --  MCV 90.4 90.1 -- --  PLT 365 367 -- --   Cardiac Enzymes:  Basename 11/30/11 0503 11/29/11 0437  CKTOTAL 35 142  CKMB 2.1 4.8*  CKMBINDEX -- --  TROPONINI <0.30 <0.30   BNP:  Basename 11/28/11 1643  PROBNP 22655.0*   D-Dimer:  Alvira Philips 11/28/11 1642  DDIMER >20.00*   CBG:  Basename 11/28/11 2251 2011/12/14 2333  GLUCAP 144* 106*   Hemoglobin A1C: No results found for this basename: HGBA1C in the last 72 hours Fasting Lipid Panel: No results found for this basename: CHOL,HDL,LDLCALC,TRIG,CHOLHDL,LDLDIRECT in the last 72 hours Thyroid Function Tests: No results found for this basename: TSH,T4TOTAL,FREET4,T3FREE,THYROIDAB in the last 72 hours Anemia Panel: No results found for this basename: VITAMINB12,FOLATE,FERRITIN,TIBC,IRON,RETICCTPCT in the last 72 hours Coagulation: No results found for this basename: LABPROT:2,INR:2 in the last 72 hours Urine Drug Screen: Drugs of Abuse  No results found for this basename: labopia, cocainscrnur, labbenz, amphetmu, thcu, labbarb    Alcohol Level: No results found for this basename: ETH:2 in the last 72 hours Urinalysis:  Basename 14-Dec-2011 1008  COLORURINE YELLOW  LABSPEC 1.015  PHURINE 6.5  GLUCOSEU NEGATIVE  HGBUR TRACE*  BILIRUBINUR NEGATIVE  KETONESUR NEGATIVE  PROTEINUR 100*  UROBILINOGEN 0.2  NITRITE NEGATIVE  LEUKOCYTESUR LARGE*   Misc. Labs:  ABGS  Basename 11/30/11 0445  PHART 7.461*  PO2ART 66.6*  TCO2 19.5  HCO3 20.7   CULTURES Recent Results (from the past 240 hour(s))  URINE CULTURE     Status: Normal (Preliminary result)   Collection Time   11/23/2011 10:08 AM      Component Value Range Status Comment   Specimen  Description URINE, CATHETERIZED   Final    Special Requests NONE   Final    Culture  Setup Time 02/01/20132101   Final    Colony Count >=100,000 COLONIES/ML   Final    Culture PROVIDENCIA STUARTII   Final    Report Status PENDING   Incomplete    Organism ID, Bacteria PROVIDENCIA STUARTII   Final   CULTURE, BLOOD (ROUTINE X 2)     Status: Normal (Preliminary result)   Collection Time   12/01/2011 11:30 AM      Component Value Range Status Comment   Specimen Description BLOOD RIGHT HAND   Final    Special Requests BOTTLES DRAWN AEROBIC ONLY 6CC BOTTLE   Final    Culture NO GROWTH 2 DAYS   Final    Report Status PENDING   Incomplete   CULTURE, BLOOD (ROUTINE X 2)     Status: Normal (Preliminary result)   Collection Time   12/04/2011 12:00 PM      Component Value Range Status Comment   Specimen Description BLOOD LEFT HAND   Final    Special Requests BOTTLES DRAWN AEROBIC ONLY 6CC BOTTLE   Final    Culture NO GROWTH 2 DAYS   Final    Report Status PENDING   Incomplete   MRSA PCR SCREENING     Status: Abnormal   Collection Time   11/27/11  5:14 PM      Component Value Range Status Comment   MRSA by PCR POSITIVE (*) NEGATIVE  Final   CULTURE, BLOOD (ROUTINE X 2)     Status: Normal (Preliminary result)   Collection Time   11/29/11  8:04 AM      Component Value Range Status Comment   Specimen Description Line, centra   Final    Special Requests NONE   Final    Culture NO GROWTH <24 HRS   Final    Report Status PENDING   Incomplete   CULTURE, BLOOD (ROUTINE X 2)     Status: Normal (Preliminary result)   Collection Time   11/29/11  9:45 AM      Component Value Range Status Comment   Specimen Description Line, centra   Final    Special Requests NONE   Final    Culture NO GROWTH <24 HRS   Final    Report Status PENDING   Incomplete    Studies/Results: Dg Chest Port 1 View  11/30/2011  *RADIOLOGY REPORT*  Clinical Data: Respiratory failure.  PORTABLE CHEST - 1 VIEW  Comparison: Chest 11/29/2011  and 11/13.  Findings: Support tubes and lines are unchanged.  Bibasilar airspace disease, greater on the left, persist with some increase in left basilar atelectasis.  There is likely a left pleural effusion.  No pneumothorax identified.  Cardiomegaly is noted.  IMPRESSION: Some increase in left effusion and basilar airspace disease. Subsegmental atelectasis in the right base is not notably changed.  Original Report Authenticated By: Bernadene Bell. Maricela Curet, M.D.   Dg Chest Port 1 View  11/29/2011  *RADIOLOGY REPORT*  Clinical Data: Respiratory failure  PORTABLE CHEST - 1 VIEW  Comparison: 11/28/2011; 12-08-11; 02/19/2011  Findings: Grossly unchanged borderline enlarged cardiac silhouette and mediastinal contours.  Stable positioning of support apparatus. No pneumothorax.  Improved aeration of the bilateral lungs with persistent bibasilar opacities, left greater than right.  Possible reduction in now small left-sided pleural effusion. No definite right-sided pleural effusion.  Grossly unchanged bones.  IMPRESSION: 1.  Stable positioning of support apparatus.  No pneumothorax.  2.  Overall improved aeration of the bilateral lungs suggestive of improved pulmonary edema with persistent bibasilar opacities, left greater than right, atelectasis versus infiltrate. 3.  Possible reduction in now small left-sided pleural effusion  Original Report Authenticated By: Waynard Reeds, M.D.   Dg Chest Port 1 View  11/28/2011  *RADIOLOGY REPORT*  Clinical Data: Cardiac arrest status post intubation.  PORTABLE CHEST - 1 VIEW  Comparison:  08-Dec-2011  Findings: Dialysis catheter tip SVC/RA junction.  Cardiomegaly. Left pleural effusion moderate sized.  Early pulmonary edema. Nasogastric tube overlies the endotracheal tube.  Endotracheal tube tip felt to lie 5.6 cm above carina.  No pneumothorax. Worsening aeration.  IMPRESSION: ET tube tip 5.6 cm above carina.  NG tube.  Cardiomegaly with early pulmonary edema.  Left pleural  effusion.  Original Report Authenticated By: Elsie Stain, M.D.    Medications:  Scheduled:   . albuterol  2.5 mg Nebulization Q4H  . antiseptic oral rinse  15 mL Mouth Rinse QID  . artificial tears   Both Eyes Q8H  . baclofen  10 mg Oral BID  . cefTRIAXone (ROCEPHIN)  IV  1 g Intravenous Q24H  . chlorhexidine  15 mL Mouth/Throat BID  . Chlorhexidine Gluconate Cloth  6 each Topical Q0600  . citalopram  40 mg Oral Daily  . dyphylline  200 mg Oral BID  . enoxaparin (LOVENOX) injection  30 mg Subcutaneous Daily  . Fluticasone-Salmeterol  1 puff Inhalation Q12H  . levetiracetam  500 mg Intravenous Q8H  . linezolid  600 mg Intravenous Q12H  . loratadine  10 mg Oral Daily  . LORazepam      . LORazepam  0.5 mg Intravenous Q6H  . losartan  100 mg Oral Daily  . mupirocin ointment  1 application Nasal BID  . oxybutynin  5 mg Oral Daily  . pantoprazole (PROTONIX) IV  40 mg Intravenous Q24H  . potassium chloride  20 mEq Oral BID  . potassium chloride      . sodium chloride      . DISCONTD: amLODipine  10 mg Oral Daily  . DISCONTD: atenolol  25 mg Oral Daily  . DISCONTD: potassium chloride  20 mEq Oral BID  . DISCONTD: theophylline  200 mg Oral Daily   Continuous:   . DOPamine 2 mcg/kg/min (11/30/11 0500)   WUJ:WJXBJYN, heparin, LORazepam  Assesment: She had cardiac arrest with ventricular fibrillation. She has chronic renal failure and her creatinine is remaining about the same. She's on dopamine. She has not had any further episodes of cardiac arrhythmia. Her potassium was low but it's better. I think she's had significant brain injury. She remains intubated and on the ventilator but she is not on much sedation at all she is getting is to try to keep her from having the myoclonic jerks. Active Problems:  Chronic kidney failure  Essential hypertension, benign  Cardiac arrest - ventricular  fibrillation    Plan: Continue with the same treatment. Await results of the EEG and  further neurological evaluation    LOS: 3 days   Janiel Derhammer L 11/30/2011, 8:02 AM

## 2011-11-30 NOTE — Procedures (Signed)
NAME:  [name], [name]              ACCOUNT NO.:  [id number]  MEDICAL RECORD NO.:  [id number]  LOCATION:  IC05                          FACILITY:  [facility]  PHYSICIAN:  Wendee Hata A. Gerilyn Pilgrim, M.D. DATE OF BIRTH:  03-21-1930  DATE OF PROCEDURE: DATE OF DISCHARGE:                             EEG INTERPRETATION   HISTORY:  A 78 year old female who has frequent clonic and myoclonic activity, suspicious for seizures.  The patient is status post cardiopulmonary arrest with suspected anoxic encephalopathy.  MEDICATIONS:  Tenormin, Cozaar, heparin, Norvasc, Lovenox, dopamine, Rocephin,  Celexa, Keppra, Ativan, Protonix, potassium.  ANALYSIS:  A 16-channel recording using standard 10/20 measurements is conducted for 20 minutes.  There is a low-voltage background activity with an activity at 25 Hz.  No photic stimulation or hyperventilation could be done.  The background activity is interrupted frequently with burst of failure activity associated with K-complexes and sharp wave vertex activity.  The EEG does react to deep painful stimuli with the background activity, approaching more of a normal background with painful activity.  There is no clear epileptiform activity.  No clear focal slowing.  IMPRESSION:  This recording shows low-voltage of electrocortical activity and evidence of somewhat a burst suppression pattern indicating encephalopathy; however, there is no evidence of epileptiform activity.     Mariadelcarmen Corella A. Gerilyn Pilgrim, M.D.     KAD/MEDQ  D:  11/30/2011  T:  11/30/2011  Job:  [id number]

## 2011-11-30 NOTE — Progress Notes (Signed)
Subjective: Interval History:  Objective: Vital signs in last 24 hours: Temp:  [98.2 F (36.8 C)-100.1 F (37.8 C)] 100 F (37.8 C) (02/13 0800) Pulse Rate:  [60-98] 72  (02/13 0800) Resp:  [8-20] 16  (02/13 0800) BP: (115-146)/(33-127) 141/37 mmHg (02/13 0800) SpO2:  [3 %-100 %] 100 % (02/13 0800) FiO2 (%):  [39.7 %-50 %] 39.9 % (02/13 0800) Weight:  [98.7 kg (217 lb 9.5 oz)] 98.7 kg (217 lb 9.5 oz) (02/13 0515)  Intake/Output from previous day: 02/12 0701 - 02/13 0700 In: 1415.5 [I.V.:80.5; NG/GT:60; IV Piggyback:975] Out: 300 [Urine:300] Intake/Output this shift: Total I/O In: 20 [Other:20] Out: -  Nutritional status: NPO    Lab Results:  [facility] 11/30/11 0503 11/29/11 0437  WBC 29.0* 33.8*  HGB 8.3* 8.9*  HCT 24.6* 26.4*  PLT 365 367  NA 135 136  K 4.9 3.8  CL 104 100  CO2 20 21  GLUCOSE 153* 202*  BUN 49* 43*  CREATININE 4.44* 4.19*  CALCIUM 9.1 9.0  LABA1C -- --   Lipid Panel No results found for this basename: CHOL,TRIG,HDL,CHOLHDL,VLDL,LDLCALC in the last 72 hours  Studies/Results: Dg Chest Port 1 View  11/30/2011  *RADIOLOGY REPORT*  Clinical Data: Respiratory failure.  PORTABLE CHEST - 1 VIEW  Comparison: Chest 11/29/2011 and 11/13.  Findings: Support tubes and lines are unchanged.  Bibasilar airspace disease, greater on the left, persist with some increase in left basilar atelectasis.  There is likely a left pleural effusion.  No pneumothorax identified.  Cardiomegaly is noted.  IMPRESSION: Some increase in left effusion and basilar airspace disease. Subsegmental atelectasis in the right base is not notably changed.  Original Report Authenticated By: Bernadene Bell. Maricela Curet, M.D.   Dg Chest Port 1 View  11/29/2011  *RADIOLOGY REPORT*  Clinical Data: Respiratory failure  PORTABLE CHEST - 1 VIEW  Comparison: 11/28/2011; 2011/12/12; 02/19/2011  Findings: Grossly unchanged borderline enlarged cardiac silhouette and mediastinal contours.  Stable positioning of  support apparatus. No pneumothorax.  Improved aeration of the bilateral lungs with persistent bibasilar opacities, left greater than right.  Possible reduction in now small left-sided pleural effusion. No definite right-sided pleural effusion.  Grossly unchanged bones.  IMPRESSION: 1.  Stable positioning of support apparatus.  No pneumothorax.  2.  Overall improved aeration of the bilateral lungs suggestive of improved pulmonary edema with persistent bibasilar opacities, left greater than right, atelectasis versus infiltrate. 3.  Possible reduction in now small left-sided pleural effusion  Original Report Authenticated By: Waynard Reeds, M.D.   Dg Chest Port 1 View  11/28/2011  *RADIOLOGY REPORT*  Clinical Data: Cardiac arrest status post intubation.  PORTABLE CHEST - 1 VIEW  Comparison:  12-12-2011  Findings: Dialysis catheter tip SVC/RA junction.  Cardiomegaly. Left pleural effusion moderate sized.  Early pulmonary edema. Nasogastric tube overlies the endotracheal tube.  Endotracheal tube tip felt to lie 5.6 cm above carina.  No pneumothorax. Worsening aeration.  IMPRESSION: ET tube tip 5.6 cm above carina.  NG tube.  Cardiomegaly with early pulmonary edema.  Left pleural effusion.  Original Report Authenticated By: Elsie Stain, M.D.    Medications:  Scheduled Meds:    . albuterol  2.5 mg Nebulization Q4H  . antiseptic oral rinse  15 mL Mouth Rinse QID  . artificial tears   Both Eyes Q8H  . baclofen  10 mg Oral BID  . cefTRIAXone (ROCEPHIN)  IV  1 g Intravenous Q24H  . chlorhexidine  15 mL Mouth/Throat BID  . Chlorhexidine  Gluconate Cloth  6 each Topical [id number]  . citalopram  40 mg Oral Daily  . dyphylline  200 mg Oral BID  . enoxaparin (LOVENOX) injection  30 mg Subcutaneous Daily  . Fluticasone-Salmeterol  1 puff Inhalation Q12H  . levetiracetam  500 mg Intravenous Q8H  . linezolid  600 mg Intravenous Q12H  . loratadine  10 mg Oral Daily  . LORazepam      . LORazepam  0.5 mg  Intravenous Q6H  . losartan  100 mg Oral Daily  . mupirocin ointment  1 application Nasal BID  . oxybutynin  5 mg Oral Daily  . pantoprazole (PROTONIX) IV  40 mg Intravenous Q24H  . potassium chloride  20 mEq Oral BID  . potassium chloride      . sodium chloride      . DISCONTD: amLODipine  10 mg Oral Daily  . DISCONTD: atenolol  25 mg Oral Daily  . DISCONTD: potassium chloride  20 mEq Oral BID  . DISCONTD: theophylline  200 mg Oral Daily   Continuous Infusions:    . DOPamine Stopped (11/30/11 0800)   PRN Meds:.heparin, heparin, LORazepam   Assessment/Plan: See dictation   LOS: 3 days   Artasia Thang

## 2011-11-30 NOTE — Progress Notes (Signed)
[name]  MRN: [id number]  DOB/AGE: 1930-02-03 78 y.o.  Primary Care Physician:KNOWLTON,STEPHEN D, MD, MD  Admit date: 12/09/2011  Chief Complaint:  Chief Complaint  Patient presents with  . Altered Mental Status    S-Pt presented on  12/09/11 with  Chief Complaint  Patient presents with  . Altered Mental Status   Pt intubated-  Pt is unresponsive  Pt is Not able to offers  complaints.   Meds     . albuterol  2.5 mg Nebulization Q4H  . antiseptic oral rinse  15 mL Mouth Rinse QID  . artificial tears   Both Eyes Q8H  . baclofen  10 mg Oral BID  . cefTRIAXone (ROCEPHIN)  IV  1 g Intravenous Q24H  . chlorhexidine  15 mL Mouth/Throat BID  . Chlorhexidine Gluconate Cloth  6 each Topical Q0600  . citalopram  40 mg Oral Daily  . dyphylline  200 mg Oral BID  . enoxaparin (LOVENOX) injection  30 mg Subcutaneous Daily  . epoetin alfa  10,000 Units Intravenous Q M,W,F-HD  . Fluticasone-Salmeterol  1 puff Inhalation Q12H  . levetiracetam  500 mg Intravenous Q8H  . linezolid  600 mg Intravenous Q12H  . loratadine  10 mg Oral Daily  . LORazepam      . LORazepam  0.5 mg Intravenous Q6H  . losartan  100 mg Oral Daily  . mupirocin ointment  1 application Nasal BID  . oxybutynin  5 mg Oral Daily  . pantoprazole (PROTONIX) IV  40 mg Intravenous Q24H  . potassium chloride  20 mEq Oral BID  . sodium chloride      . DISCONTD: amLODipine  10 mg Oral Daily  . DISCONTD: atenolol  25 mg Oral Daily  . DISCONTD: potassium chloride  20 mEq Oral BID  . DISCONTD: theophylline  200 mg Oral Daily         NWG:NFAOZ from the symptoms mentioned above,there are no other symptoms referable to all systems reviewed.  Physical Exam: Vital signs in last 24 hours: Temp:  [98.2 F (36.8 C)-100.1 F (37.8 C)] 100 F (37.8 C) (02/13 0800) Pulse Rate:  [60-98] 72  (02/13 0800) Resp:  [8-20] 16  (02/13 0800) BP: (115-146)/(33-127) 141/37 mmHg (02/13 0800) SpO2:  [3 %-100 %] 100 %  (02/13 1057) FiO2 (%):  [39.7 %-50 %] 40 % (02/13 1057) Weight:  [217 lb 9.5 oz (98.7 kg)] 217 lb 9.5 oz (98.7 kg) (02/13 0515) Weight change: 10 lb 9.3 oz (4.8 kg) Last BM Date: 11/28/11  Intake/Output from previous day: 02/12 0701 - 02/13 0700 In: 1415.5 [I.V.:80.5; NG/GT:60; IV Piggyback:975] Out: 300 [Urine:300] Total I/O In: 20 [Other:20] Out: -    Physical Exam: General appearance: Unresponsive with occasional eye opening  Resp: rhonchi bilaterally . Intubated  Cardio: regular rate and rhythm, S1, S2 normal, no murmur,  GI: soft, non-tender; bowel decreased ; no masses, no organomegaly  Extremities: extremities normal, atraumatic, no cyanosis or edema  Access- PC Right IJ    Lab Results: CBC  Basename 11/30/11 0503 11/29/11 0437  WBC 29.0* 33.8*  HGB 8.3* 8.9*  HCT 24.6* 26.4*  PLT 365 367    BMET  Basename 11/30/11 0503 11/29/11 0437  NA 135 136  K 4.9 3.8  CL 104 100  CO2 20 21  GLUCOSE 153* 202*  BUN 49* 43*  CREATININE 4.44* 4.19*  CALCIUM 9.1 9.0    MICRO Recent Results (from the past 240 hour(s))  URINE CULTURE  Status: Normal (Preliminary result)   Collection Time   12/02/2011 10:08 AM      Component Value Range Status Comment   Specimen Description URINE, CATHETERIZED   Final    Special Requests NONE   Final    Culture  Setup Time 02/12/20132101   Final    Colony Count >=100,000 COLONIES/ML   Final    Culture     Final    Value: PROVIDENCIA STUARTII     ENTEROCOCCUS SPECIES   Report Status PENDING   Incomplete    Organism ID, Bacteria PROVIDENCIA STUARTII   Final   CULTURE, BLOOD (ROUTINE X 2)     Status: Normal (Preliminary result)   Collection Time   12/08/2011 11:30 AM      Component Value Range Status Comment   Specimen Description BLOOD RIGHT HAND   Final    Special Requests BOTTLES DRAWN AEROBIC ONLY 6CC BOTTLE   Final    Culture NO GROWTH 2 DAYS   Final    Report Status PENDING   Incomplete   CULTURE, BLOOD (ROUTINE X 2)      Status: Normal (Preliminary result)   Collection Time   12/06/2011 12:00 PM      Component Value Range Status Comment   Specimen Description BLOOD LEFT HAND   Final    Special Requests BOTTLES DRAWN AEROBIC ONLY 6CC BOTTLE   Final    Culture NO GROWTH 2 DAYS   Final    Report Status PENDING   Incomplete   MRSA PCR SCREENING     Status: Abnormal   Collection Time   11/27/11  5:14 PM      Component Value Range Status Comment   MRSA by PCR POSITIVE (*) NEGATIVE  Final   CULTURE, BLOOD (ROUTINE X 2)     Status: Normal (Preliminary result)   Collection Time   11/29/11  8:04 AM      Component Value Range Status Comment   Specimen Description Line, centra   Final    Special Requests NONE   Final    Culture NO GROWTH <24 HRS   Final    Report Status PENDING   Incomplete   CULTURE, BLOOD (ROUTINE X 2)     Status: Normal (Preliminary result)   Collection Time   11/29/11  9:45 AM      Component Value Range Status Comment   Specimen Description Line, centra   Final    Special Requests NONE   Final    Culture NO GROWTH <24 HRS   Final    Report Status PENDING   Incomplete       Lab Results  Component Value Date   CALCIUM 9.1 11/30/2011   PHOS 1.9* 11/29/2011           Impression: 1)Renal  ESRD on HD                ON TTS dialysis schedule                Dialysis held yesterday secondary to clinical/hemodynamic instability.                Pt is currently being dialyzed.                Will see if pt tolerates treatment today.    2)[facility]- Pt with recent hx of Cardiac arrest.               Vasopressors now being held.   3)Anemia HGb  at goal (9--11)              On Epo during HD  4)CKD Mineral-Bone Disorder Phosphorus not at goal.- Not on Binders    5)CNS- Admitted with AMS              Encephalopathy after Cardiac arrest neurology following.              6)FEN  Normokalemic NOrmonatremic   7)Acid base Co2 at goal  8) ID- Septic shock - On IV ABX  9) Prognosis-  poor   Plan:  Pt ii currently  Being dialyzed.-will see if she tolerated treatment today. Pt has poor prognosis secondary to Multiple issues. Will continue current care for now .        Jarvis Knodel S 11/30/2011, 11:02 AM

## 2011-11-30 NOTE — Progress Notes (Signed)
Subjective:  Unresponsive on the vent. Receiving dialysis.  Objective:  Vital Signs in the last 24 hours: Temp:  [98.2 F (36.8 C)-100.1 F (37.8 C)] 100 F (37.8 C) (02/13 0800) Pulse Rate:  [60-98] 72  (02/13 0800) Resp:  [8-20] 16  (02/13 0800) BP: (115-146)/(29-127) 141/37 mmHg (02/13 0800) SpO2:  [3 %-100 %] 100 % (02/13 0800) FiO2 (%):  [39.7 %-50 %] 39.9 % (02/13 0800) Weight:  [217 lb 9.5 oz (98.7 kg)] 217 lb 9.5 oz (98.7 kg) (02/13 0515)  Intake/Output from previous day: 02/12 0701 - 02/13 0700 In: 1415.5 [I.V.:80.5; NG/GT:60; IV Piggyback:975] Out: 300 [Urine:300] Intake/Output from this shift: Total I/O In: 20 [Other:20] Out: -   Physical Exam: NECK: Without JVD, HJR, or bruit LUNGS: Decreased breath sounds throughout HEART:distant heart sounds, Regular rate and rhythm, no murmur, gallop, rub, bruit, thrill, or heave EXTREMITIES: Without cyanosis, clubbing  Lab Results:  Basename 11/30/11 0503 11/29/11 0437  WBC 29.0* 33.8*  HGB 8.3* 8.9*  PLT 365 367    Basename 11/30/11 0503 11/29/11 0437  NA 135 136  K 4.9 3.8  CL 104 100  CO2 20 21  GLUCOSE 153* 202*  BUN 49* 43*  CREATININE 4.44* 4.19*    Basename 11/30/11 0503 11/29/11 0437  TROPONINI <0.30 <0.30   Hepatic Function Panel  Basename 11/28/11 1642  PROT 6.1  ALBUMIN 2.2*  AST 24  ALT 18  ALKPHOS 263*  BILITOT 0.4  BILIDIR --  IBILI --   Echocardiogram-normal LV function.  Assessment/Plan:  1. Status post ventricular fibrillation cardiac arrest: no further ventricular arrythmias. DNR.  2. Anoxic brain injury: She is being followed by Dr.[place], neurologist.   3 ESRD: She is being followed by [facility].  Jacolyn Reedy 11/30/2011, 8:50 AM   Cardiology Attending Patient interviewed and examined. Discussed with Jacolyn Reedy, PA-C.  Above note annotated and modified based upon my findings.  Lack of significant neurologic improvement over first 36 hrs post-anoxic brain injury  suggests poor prognosis for neurologic recovery.  Normal LV size and function-MI ruled out.  Suggests that this was not primarily cardiac event.  Due either to metabolic disturbance or respiratory insufficiency.  Will follow as needed.  [place] Bing, MD 11/30/2011, 7:31 PM

## 2011-12-01 LAB — HEPATITIS B SURFACE ANTIGEN: Hepatitis B Surface Ag: NEGATIVE

## 2011-12-01 LAB — URINE CULTURE: Colony Count: >=100000

## 2011-12-01 MED ORDER — PIPERACILLIN-TAZOBACTAM IN DEX 2-0.25 GM/50ML IV SOLN
2.2500 g | Freq: Three times a day (TID) | INTRAVENOUS | Status: DC
  Administered 2011-12-01 – 2011-12-04 (×8): 2.25 g via INTRAVENOUS
  Filled 2011-12-01 (×13): qty 50

## 2011-12-01 MED ORDER — SODIUM CHLORIDE 0.9 % IJ SOLN
INTRAMUSCULAR | Status: AC
  Filled 2011-12-01: qty 3

## 2011-12-01 MED ORDER — EPOETIN ALFA 10000 UNIT/ML IJ SOLN
12000.0000 [IU] | INTRAMUSCULAR | Status: DC
  Administered 2011-12-02: 12000 [IU] via INTRAVENOUS
  Filled 2011-12-01: qty 2

## 2011-12-01 NOTE — Progress Notes (Signed)
Pt has no IV access; notified MD, no new orders given, will continue to monitor pt 

## 2011-12-01 NOTE — Progress Notes (Addendum)
Subjective: She is overall about the same. She is basically unresponsive. I had her off the ventilator briefly to see if she has spontaneous respirations and she does. She actually had a cough effort as well. She does not make any meaningful response however other than that.  Objective: Vital signs in last 24 hours: Temp:  [98 F (36.7 C)-100.2 F (37.9 C)] 98.4 F (36.9 C) (02/14 0508) Pulse Rate:  [62-89] 74  (02/14 0600) Resp:  [14-22] 15  (02/14 0600) BP: (131-168)/(33-111) 145/40 mmHg (02/14 0600) SpO2:  [87 %-100 %] 96 % (02/14 0736) FiO2 (%):  [39.7 %-99.9 %] 99.7 % (02/14 0740) Weight:  [96 kg (211 lb 10.3 oz)] 96 kg (211 lb 10.3 oz) (02/14 0508) Weight change: -2.7 kg (-5 lb 15.2 oz) Last BM Date: 11/28/11  Intake/Output from previous day: 02/13 0701 - 02/14 0700 In: 1050 [NG/GT:60; IV Piggyback:570] Out: 2901 [Urine:900; Stool:1]  PHYSICAL EXAM General appearance: no distress and Poorly responsive Resp: rhonchi bilaterally Cardio: regular rate and rhythm, S1, S2 normal, no murmur, click, rub or gallop GI: soft, non-tender; bowel sounds normal; no masses,  no organomegaly Extremities: extremities normal, atraumatic, no cyanosis or edema  Lab Results:    Basic Metabolic Panel:  Basename 11/30/11 0503 11/29/11 0437  NA 135 136  K 4.9 3.8  CL 104 100  CO2 20 21  GLUCOSE 153* 202*  BUN 49* 43*  CREATININE 4.44* 4.19*  CALCIUM 9.1 9.0  MG -- --  PHOS -- 1.9*   Liver Function Tests:  Basename 11/28/11 1642  AST 24  ALT 18  ALKPHOS 263*  BILITOT 0.4  PROT 6.1  ALBUMIN 2.2*   No results found for this basename: LIPASE:2,AMYLASE:2 in the last 72 hours No results found for this basename: AMMONIA:2 in the last 72 hours CBC:  Basename 11/30/11 0503 11/29/11 0437 11/28/11 1642  WBC 29.0* 33.8* --  NEUTROABS -- -- 30.0*  HGB 8.3* 8.9* --  HCT 24.6* 26.4* --  MCV 90.4 90.1 --  PLT 365 367 --   Cardiac Enzymes:  Basename 11/30/11 0503 11/29/11 0437    CKTOTAL 35 142  CKMB 2.1 4.8*  CKMBINDEX -- --  TROPONINI <0.30 <0.30   BNP:  Basename 11/28/11 1643  PROBNP 22655.0*   D-Dimer:  Alvira Philips 11/28/11 1642  DDIMER >20.00*   CBG:  Basename 11/28/11 2251  GLUCAP 144*   Hemoglobin A1C: No results found for this basename: HGBA1C in the last 72 hours Fasting Lipid Panel: No results found for this basename: CHOL,HDL,LDLCALC,TRIG,CHOLHDL,LDLDIRECT in the last 72 hours Thyroid Function Tests: No results found for this basename: TSH,T4TOTAL,FREET4,T3FREE,THYROIDAB in the last 72 hours Anemia Panel: No results found for this basename: VITAMINB12,FOLATE,FERRITIN,TIBC,IRON,RETICCTPCT in the last 72 hours Coagulation: No results found for this basename: LABPROT:2,INR:2 in the last 72 hours Urine Drug Screen: Drugs of Abuse  No results found for this basename: labopia, cocainscrnur, labbenz, amphetmu, thcu, labbarb    Alcohol Level: No results found for this basename: ETH:2 in the last 72 hours Urinalysis: No results found for this basename: COLORURINE:2,APPERANCEUR:2,LABSPEC:2,PHURINE:2,GLUCOSEU:2,HGBUR:2,BILIRUBINUR:2,KETONESUR:2,PROTEINUR:2,UROBILINOGEN:2,NITRITE:2,LEUKOCYTESUR:2 in the last 72 hours Misc. Labs:  ABGS  Basename 11/30/11 0445  PHART 7.461*  PO2ART 66.6*  TCO2 19.5  HCO3 20.7   CULTURES Recent Results (from the past 240 hour(s))  URINE CULTURE     Status: Normal   Collection Time   12/03/2011 10:08 AM      Component Value Range Status Comment   Specimen Description URINE, CATHETERIZED   Final  Special Requests NONE   Final    Culture  Setup Time 02/27/20132101   Final    Colony Count >=100,000 COLONIES/ML   Final    Culture     Final    Value: PROVIDENCIA STUARTII     ENTEROCOCCUS SPECIES   Report Status 12/01/2011 FINAL   Final    Organism ID, Bacteria PROVIDENCIA STUARTII   Final    Organism ID, Bacteria ENTEROCOCCUS SPECIES   Final   CULTURE, BLOOD (ROUTINE X 2)     Status: Normal (Preliminary  result)   Collection Time   11/20/2011 11:30 AM      Component Value Range Status Comment   Specimen Description BLOOD RIGHT HAND   Final    Special Requests BOTTLES DRAWN AEROBIC ONLY 6CC BOTTLE   Final    Culture NO GROWTH 3 DAYS   Final    Report Status PENDING   Incomplete   CULTURE, BLOOD (ROUTINE X 2)     Status: Normal (Preliminary result)   Collection Time   12/12/2011 12:00 PM      Component Value Range Status Comment   Specimen Description BLOOD LEFT HAND   Final    Special Requests BOTTLES DRAWN AEROBIC ONLY 6CC BOTTLE   Final    Culture NO GROWTH 3 DAYS   Final    Report Status PENDING   Incomplete   MRSA PCR SCREENING     Status: Abnormal   Collection Time   11/25/2011  5:14 PM      Component Value Range Status Comment   MRSA by PCR POSITIVE (*) NEGATIVE  Final   CULTURE, BLOOD (ROUTINE X 2)     Status: Normal (Preliminary result)   Collection Time   11/29/11  8:04 AM      Component Value Range Status Comment   Specimen Description BLOOD RIGHT HAND   Final    Special Requests BOTTLES DRAWN AEROBIC AND ANAEROBIC 6CC   Final    Culture NO GROWTH 1 DAY   Final    Report Status PENDING   Incomplete   CULTURE, BLOOD (ROUTINE X 2)     Status: Normal (Preliminary result)   Collection Time   11/29/11  9:45 AM      Component Value Range Status Comment   Specimen Description BLOOD HEMODIALYSIS CATHETER DRAWN BY RN   Final    Special Requests     Final    Value: BOTTLES DRAWN AEROBIC AND ANAEROBIC AEB=10CC ANA=6CC   Culture NO GROWTH 1 DAY   Final    Report Status PENDING   Incomplete    Studies/Results: Ct Head Wo Contrast  11/30/2011  *RADIOLOGY REPORT*  Clinical Data: Unresponsive, respiratory failure, cardiac arrest  CT HEAD WITHOUT CONTRAST  Technique:  Contiguous axial images were obtained from the base of the skull through the vertex without contrast.  Comparison: CT 11/27/2011  Findings: Image quality degraded by mild motion.  Multiple images were repeated.  Ventricle size is  normal.  Poor definition of gray and white matter may be due to diffuse cerebral edema however could be due to motion and scan technique.  Sulci are patent and similar to the prior study which would indicate that there is not severe  brain edema.  Negative for hemorrhage.  No focal infarct or mass.  Air-fluid levels in the sphenoid sinus have developed since the prior study.  Mucosal thickening is present in the maxillary and ethmoid sinuses.  IMPRESSION: Wallace Cullens white matter are poorly differentiated.  This may be due to diffuse cerebral edema such as from global anoxia.  However, this may be technical in nature.  Continued follow-up is suggested.  Original Report Authenticated By: Camelia Phenes, M.D.   Dg Chest Port 1 View  11/30/2011  *RADIOLOGY REPORT*  Clinical Data: Respiratory failure.  PORTABLE CHEST - 1 VIEW  Comparison: Chest 11/29/2011 and 11/13.  Findings: Support tubes and lines are unchanged.  Bibasilar airspace disease, greater on the left, persist with some increase in left basilar atelectasis.  There is likely a left pleural effusion.  No pneumothorax identified.  Cardiomegaly is noted.  IMPRESSION: Some increase in left effusion and basilar airspace disease. Subsegmental atelectasis in the right base is not notably changed.  Original Report Authenticated By: Bernadene Bell. Maricela Curet, M.D.    Medications:  Scheduled:   . albuterol  2.5 mg Nebulization Q4H  . antiseptic oral rinse  15 mL Mouth Rinse QID  . artificial tears   Both Eyes Q8H  . baclofen  10 mg Oral BID  . cefTRIAXone (ROCEPHIN)  IV  1 g Intravenous Q24H  . chlorhexidine  15 mL Mouth/Throat BID  . Chlorhexidine Gluconate Cloth  6 each Topical Q0600  . citalopram  40 mg Oral Daily  . dyphylline  200 mg Oral BID  . enoxaparin (LOVENOX) injection  30 mg Subcutaneous Daily  . epoetin alfa  10,000 Units Intravenous Q M,W,F-HD  . Fluticasone-Salmeterol  1 puff Inhalation Q12H  . levetiracetam  500 mg Intravenous Q8H  . linezolid   600 mg Intravenous Q12H  . loratadine  10 mg Oral Daily  . LORazepam  0.5 mg Intravenous Q6H  . losartan  100 mg Oral Daily  . mupirocin ointment  1 application Nasal BID  . oxybutynin  5 mg Oral Daily  . pantoprazole (PROTONIX) IV  40 mg Intravenous Q24H  . potassium chloride  20 mEq Oral BID  . sodium chloride       Continuous:   . DOPamine Stopped (11/30/11 0800)   WUJ:WJXBJYN, heparin, LORazepam  Assesment: She has respiratory failure related to a cardiac arrest. She has had what appears to be some brain injury but does have some spontaneous respirations. I discussed her situation at length with her son last night and he wants to give her a chance to improve but does not want to continue ventilator support indefinitely. Active Problems:  Chronic kidney failure  Essential hypertension, benign  Cardiac arrest - ventricular fibrillation    Plan: Continue with current treatments including ventilator support for now I will discuss with her primary care physician and neurology    LOS: 4 days   Roisin Mones L 12/01/2011, 7:47 AM   While she was having an IV started she did make a withdrawal from the nurse

## 2011-12-01 NOTE — Progress Notes (Signed)
Subjective: Interval History: none.  Objective: Vital signs in last 24 hours: Temp:  [98 F (36.7 C)-100.2 F (37.9 C)] 98.9 F (37.2 C) (02/14 0800) Pulse Rate:  [62-89] 74  (02/14 0600) Resp:  [14-22] 15  (02/14 0600) BP: (131-168)/(33-111) 145/40 mmHg (02/14 0600) SpO2:  [87 %-100 %] 96 % (02/14 0736) FiO2 (%):  [39.7 %-99.9 %] 99.7 % (02/14 0740) Weight:  [96 kg (211 lb 10.3 oz)] 96 kg (211 lb 10.3 oz) (02/14 0508) Weight change: -2.7 kg (-5 lb 15.2 oz)  Intake/Output from previous day: 02/13 0701 - 02/14 0700 In: 1050 [NG/GT:60; IV Piggyback:570] Out: 2901 [Urine:900; Stool:1] Intake/Output this shift:    General appearance: syndromic appearance - Patient is somnolent and only respond by withdrawing her leg to pain. Resp: diminished breath sounds bilaterally and wheezes bilaterally Cardio: regular rate and rhythm, S1, S2 normal, no murmur, click, rub or gallop GI: soft, non-tender; bowel sounds normal; no masses,  no organomegaly Extremities: edema Trace edema.  Lab Results:  Basename 11/30/11 0503 11/29/11 0437  WBC 29.0* 33.8*  HGB 8.3* 8.9*  HCT 24.6* 26.4*  PLT 365 367   BMET:  Basename 11/30/11 0503 11/29/11 0437  NA 135 136  K 4.9 3.8  CL 104 100  CO2 20 21  GLUCOSE 153* 202*  BUN 49* 43*  CREATININE 4.44* 4.19*  CALCIUM 9.1 9.0   No results found for this basename: PTH:2 in the last 72 hours Iron Studies: No results found for this basename: IRON,TIBC,TRANSFERRIN,FERRITIN in the last 72 hours  Studies/Results: Ct Head Wo Contrast  11/30/2011  *RADIOLOGY REPORT*  Clinical Data: Unresponsive, respiratory failure, cardiac arrest  CT HEAD WITHOUT CONTRAST  Technique:  Contiguous axial images were obtained from the base of the skull through the vertex without contrast.  Comparison: CT 2011-12-21  Findings: Image quality degraded by mild motion.  Multiple images were repeated.  Ventricle size is normal.  Poor definition of gray and white matter may be due to  diffuse cerebral edema however could be due to motion and scan technique.  Sulci are patent and similar to the prior study which would indicate that there is not severe  brain edema.  Negative for hemorrhage.  No focal infarct or mass.  Air-fluid levels in the sphenoid sinus have developed since the prior study.  Mucosal thickening is present in the maxillary and ethmoid sinuses.  IMPRESSION: Wallace Cullens white matter are poorly differentiated.  This may be due to diffuse cerebral edema such as from global anoxia.  However, this may be technical in nature.  Continued follow-up is suggested.  Original Report Authenticated By: Camelia Phenes, M.D.   Dg Chest Port 1 View  11/30/2011  *RADIOLOGY REPORT*  Clinical Data: Respiratory failure.  PORTABLE CHEST - 1 VIEW  Comparison: Chest 11/29/2011 and 11/13.  Findings: Support tubes and lines are unchanged.  Bibasilar airspace disease, greater on the left, persist with some increase in left basilar atelectasis.  There is likely a left pleural effusion.  No pneumothorax identified.  Cardiomegaly is noted.  IMPRESSION: Some increase in left effusion and basilar airspace disease. Subsegmental atelectasis in the right base is not notably changed.  Original Report Authenticated By: Bernadene Bell. Maricela Curet, M.D.    I have reviewed the patient'[name] current medications.  Assessment/Plan: Problem #1 end-stage renal disease she status post hemodialysis yesterday her BUN is 49 creatinine of 4.4 her with potassium of 4.9 seems to be stable. Problem #2 history of altered mental status presently patient is  unresponsive. The only respond she has is leg withdrawal from pain.. Problem #3 hyotension blood pressure seems to be somewhat better Problem #4 history of sepsis presently patient is a febrile her white blood cell count is improving blood cultures still no gross. Chest x-ray with bibasilar infiltrate possible pneumonia versus urinary tract infection. Patient is on antibiotics. Her blood  pressure also seems to be improving. Problem #5 history of anemia Problem #6 history of obesity Problem #7 history of respiratory failure patient is he remains intubated. Plan: We'll make arrangement for patient to get dialysis tomorrow We'll continue to follow her basic metabolic panel and CBC We'll try to challenge her with more tomorrow probably and  get about 3-4 L We'll continue his other topics as before.    LOS: 4 days   [name] 12/01/2011,8:17 AM

## 2011-12-01 NOTE — Progress Notes (Signed)
Subjective: Interval History:  Objective: Vital signs in last 24 hours: Temp:  [98 F (36.7 C)-100.2 F (37.9 C)] 98.1 F (36.7 C) (02/14 1600) Pulse Rate:  [69-84] 71  (02/14 1800) Resp:  [14-26] 15  (02/14 1800) BP: (125-168)/(33-62) 134/35 mmHg (02/14 1800) SpO2:  [94 %-100 %] 99 % (02/14 1800) FiO2 (%):  [39.8 %-99.9 %] 40.1 % (02/14 1800) Weight:  [96 kg (211 lb 10.3 oz)] 96 kg (211 lb 10.3 oz) (02/14 0508)  Intake/Output from previous day: 02/13 0701 - 02/14 0700 In: 1050 [NG/GT:60; IV Piggyback:570] Out: 2901 [Urine:900; Stool:1] Intake/Output this shift: Total I/O In: 265 [IV Piggyback:265] Out: 402 [Urine:400; Stool:2] Nutritional status: NPO    Lab Results:  Basename 11/30/11 0503 11/29/11 0437  WBC 29.0* 33.8*  HGB 8.3* 8.9*  HCT 24.6* 26.4*  PLT 365 367  NA 135 136  K 4.9 3.8  CL 104 100  CO2 20 21  GLUCOSE 153* 202*  BUN 49* 43*  CREATININE 4.44* 4.19*  CALCIUM 9.1 9.0  LABA1C -- --   Lipid Panel No results found for this basename: CHOL,TRIG,HDL,CHOLHDL,VLDL,LDLCALC in the last 72 hours  Studies/Results: Ct Head Wo Contrast  11/30/2011  *RADIOLOGY REPORT*  Clinical Data: Unresponsive, respiratory failure, cardiac arrest  CT HEAD WITHOUT CONTRAST  Technique:  Contiguous axial images were obtained from the base of the skull through the vertex without contrast.  Comparison: CT 11/25/2011  Findings: Image quality degraded by mild motion.  Multiple images were repeated.  Ventricle size is normal.  Poor definition of gray and white matter may be due to diffuse cerebral edema however could be due to motion and scan technique.  Sulci are patent and similar to the prior study which would indicate that there is not severe  brain edema.  Negative for hemorrhage.  No focal infarct or mass.  Air-fluid levels in the sphenoid sinus have developed since the prior study.  Mucosal thickening is present in the maxillary and ethmoid sinuses.  IMPRESSION: Wallace Cullens white matter  are poorly differentiated.  This may be due to diffuse cerebral edema such as from global anoxia.  However, this may be technical in nature.  Continued follow-up is suggested.  Original Report Authenticated By: Camelia Phenes, M.D.   Dg Chest Port 1 View  11/30/2011  *RADIOLOGY REPORT*  Clinical Data: Respiratory failure.  PORTABLE CHEST - 1 VIEW  Comparison: Chest 11/29/2011 and 11/13.  Findings: Support tubes and lines are unchanged.  Bibasilar airspace disease, greater on the left, persist with some increase in left basilar atelectasis.  There is likely a left pleural effusion.  No pneumothorax identified.  Cardiomegaly is noted.  IMPRESSION: Some increase in left effusion and basilar airspace disease. Subsegmental atelectasis in the right base is not notably changed.  Original Report Authenticated By: Bernadene Bell. Maricela Curet, M.D.    Medications:  Scheduled Meds:    . albuterol  2.5 mg Nebulization Q4H  . antiseptic oral rinse  15 mL Mouth Rinse QID  . artificial tears   Both Eyes Q8H  . baclofen  10 mg Oral BID  . chlorhexidine  15 mL Mouth/Throat BID  . Chlorhexidine Gluconate Cloth  6 each Topical Q0600  . citalopram  40 mg Oral Daily  . enoxaparin (LOVENOX) injection  30 mg Subcutaneous Daily  . epoetin alfa  12,000 Units Intravenous 3 times weekly  . Fluticasone-Salmeterol  1 puff Inhalation Q12H  . levetiracetam  500 mg Intravenous Q8H  . loratadine  10 mg Oral Daily  .  LORazepam  0.5 mg Intravenous Q6H  . losartan  100 mg Oral Daily  . mupirocin ointment  1 application Nasal BID  . oxybutynin  5 mg Oral Daily  . pantoprazole (PROTONIX) IV  40 mg Intravenous Q24H  . piperacillin-tazobactam (ZOSYN)  IV  2.25 g Intravenous Q8H  . potassium chloride  20 mEq Oral BID  . sodium chloride      . DISCONTD: cefTRIAXone (ROCEPHIN)  IV  1 g Intravenous Q24H  . DISCONTD: dyphylline  200 mg Oral BID  . DISCONTD: epoetin alfa  10,000 Units Intravenous Q M,W,F-HD  . DISCONTD: linezolid  600  mg Intravenous Q12H   Continuous Infusions:    . DOPamine Stopped (11/30/11 0800)   PRN Meds:.heparin, heparin, LORazepam   Assessment/Plan: See dictation   LOS: 4 days   Latrell Potempa

## 2011-12-01 NOTE — Progress Notes (Signed)
[name], GASS              ACCOUNT NO.:  [id number]  MEDICAL RECORD NO.:  [id number]  LOCATION:  IC05                          FACILITY:  [facility]  PHYSICIAN:  Mila Homer. Sudie Bailey, M.D.DATE OF BIRTH:  1930/02/26  DATE OF PROCEDURE: DATE OF DISCHARGE:                                PROGRESS NOTE   SUBJECTIVE:  She is still on the vent in the ICU.  Apparently, she may have moved her right arm, raising it upwards and then dropping it at some point while nursing was working with her.  She was also off the vent for a short time this morning and apparently did take some breaths on her own.  OBJECTIVE:  VITAL SIGNS:  Temperature is 98.9, pulse 69, respiratory rate 17, blood pressure 125/40.  GENERAL:  She is supine in the bed in the ICU.  She is not responding to questioning. LUNGS:  Her lungs appear to be clear throughout. SKIN:  Her color is good. HEART:  Her heart has a regular rhythm.  Rate of about 70. ABDOMEN:  Her abdomen is soft.  There is no edema of the ankles.  White cell count 29,000, hemoglobin 8.3.  Her BUN was 44, creatinine 4.44 yesterday.  CT of the head showed question diffuse cerebral edema.  ASSESSMENT: 1. Anoxic encephalopathy, status post cardiopulmonary arrest. 2. Urinary tract infection secondary to Providencia stuartii and     enterococcus species.  The Providencia is sensitive to ceftriaxone     and piperacillin-tazobactam.  Enterococcus is sensitive to     nitrofurantoin, vancomycin and ampicillin. 3. Chronic renal insufficiency requiring hemodialysis 3 times a week. 4. Chronic infection of the left knee now status post 2 total knee     surgeries with removal of hardware and now with a spacing device.  PLAN:  Continue the current regimens and treatments.  Antibiotics have been changed so she is now on Zosyn q.8 hours as the one IV antibiotic.    Mila Homer. Sudie Bailey, M.D.    SDK/MEDQ  D:  12/01/2011  T:  12/01/2011  Job:  [id number]

## 2011-12-01 NOTE — Progress Notes (Signed)
RT weaned patient today.  Patient did fail SBT so I increased her pressure support to 15.  She weaned on CPAP for about 6 hours today.  Suctioned small amounts of small, white secretions.  BBS are rhonchi.  Patient on rest mode and try SBT in am.  Will continue to monitor.

## 2011-12-01 NOTE — Progress Notes (Signed)
Notified Dr. Felecia Shelling of pt's vascular access issues; no new orders given, told to let morning MD make decision; will continue to monitor pt

## 2011-12-02 ENCOUNTER — Inpatient Hospital Stay (HOSPITAL_COMMUNITY): Payer: Medicare Other

## 2011-12-02 LAB — CBC
HCT: 23.6 % — ABNORMAL LOW (ref 36.0–46.0)
MCH: 30.3 pg (ref 26.0–34.0)
MCV: 92.9 fL (ref 78.0–100.0)
Platelets: 320 10*3/uL (ref 150–400)
RBC: 2.54 MIL/uL — ABNORMAL LOW (ref 3.87–5.11)
RDW: 15.8 % — ABNORMAL HIGH (ref 11.5–15.5)
WBC: 22.9 10*3/uL — ABNORMAL HIGH (ref 4.0–10.5)

## 2011-12-02 LAB — CULTURE, BLOOD (ROUTINE X 2)
Culture: NO GROWTH
Culture: NO GROWTH

## 2011-12-02 LAB — BASIC METABOLIC PANEL
CO2: 24 mEq/L (ref 19–32)
Calcium: 9 mg/dL (ref 8.4–10.5)
Chloride: 104 mEq/L (ref 96–112)
Creatinine, Ser: 3.35 mg/dL — ABNORMAL HIGH (ref 0.50–1.10)
Glucose, Bld: 93 mg/dL (ref 70–99)

## 2011-12-02 MED ORDER — SODIUM CHLORIDE 0.9 % IJ SOLN
INTRAMUSCULAR | Status: AC
  Administered 2011-12-02: 10 mL
  Filled 2011-12-02: qty 3

## 2011-12-02 MED ORDER — SODIUM CHLORIDE 0.9 % IJ SOLN
INTRAMUSCULAR | Status: AC
  Administered 2011-12-02: 16:00:00
  Filled 2011-12-02: qty 3

## 2011-12-02 MED ORDER — SODIUM CHLORIDE 0.9 % IJ SOLN
3.0000 mL | INTRAMUSCULAR | Status: DC | PRN
  Administered 2011-12-02 (×2): 3 mL via INTRAVENOUS
  Filled 2011-12-02: qty 3

## 2011-12-02 MED ORDER — SODIUM CHLORIDE 0.9 % IJ SOLN
INTRAMUSCULAR | Status: AC
  Administered 2011-12-02: 10 mL
  Filled 2011-12-02: qty 6

## 2011-12-02 MED ORDER — SODIUM CHLORIDE 0.9 % IV BOLUS (SEPSIS)
250.0000 mL | Freq: Once | INTRAVENOUS | Status: AC
  Administered 2011-12-02: 250 mL via INTRAVENOUS

## 2011-12-02 MED ORDER — JEVITY 1.2 CAL PO LIQD
1000.0000 mL | ORAL | Status: DC
  Administered 2011-12-02 – 2011-12-03 (×2): 1000 mL via ORAL
  Filled 2011-12-02 (×2): qty 1000

## 2011-12-02 MED ORDER — SODIUM CHLORIDE 0.9 % IJ SOLN
INTRAMUSCULAR | Status: AC
  Filled 2011-12-02: qty 6

## 2011-12-02 NOTE — Progress Notes (Signed)
Subjective: She is overall about the same today. She has had some withdrawal to painful stimuli however. She did have spontaneous respirations when I took her off the ventilator briefly yesterday. I discussed her situation last night with her family again and reported that she had shown some improvement and they want to continue with current treatments at least for now.  Objective: Vital signs in last 24 hours: Temp:  [98.1 F (36.7 C)-100.7 F (38.2 C)] 99.7 F (37.6 C) (02/15 0000) Pulse Rate:  [64-88] 72  (02/15 0600) Resp:  [6-26] 15  (02/15 0600) BP: (125-167)/(32-43) 157/43 mmHg (02/15 0600) SpO2:  [93 %-100 %] 99 % (02/15 0736) FiO2 (%):  [39.8 %-40.5 %] 40 % (02/15 0736) Weight:  [96.8 kg (213 lb 6.5 oz)] 96.8 kg (213 lb 6.5 oz) (02/15 0500) Weight change: 0.8 kg (1 lb 12.2 oz) Last BM Date: 12/01/11  Intake/Output from previous day: 02/14 0701 - 02/15 0700 In: 525 [IV Piggyback:525] Out: 802 [Urine:800; Stool:2]  PHYSICAL EXAM General appearance: no distress and Intubated Resp: rhonchi bilaterally Cardio: regular rate and rhythm, S1, S2 normal, no murmur, click, rub or gallop GI: soft, non-tender; bowel sounds normal; no masses,  no organomegaly Extremities: extremities normal, atraumatic, no cyanosis or edema  Lab Results:    Basic Metabolic Panel:  Basename 12/02/11 0421 11/30/11 0503  NA 137 135  K 5.3* 4.9  CL 104 104  CO2 24 20  GLUCOSE 93 153*  BUN 28* 49*  CREATININE 3.35* 4.44*  CALCIUM 9.0 9.1  MG -- --  PHOS -- --   Liver Function Tests: No results found for this basename: AST:2,ALT:2,ALKPHOS:2,BILITOT:2,PROT:2,ALBUMIN:2 in the last 72 hours No results found for this basename: LIPASE:2,AMYLASE:2 in the last 72 hours No results found for this basename: AMMONIA:2 in the last 72 hours CBC:  Basename 12/02/11 0421 11/30/11 0503  WBC 22.9* 29.0*  NEUTROABS -- --  HGB 7.7* 8.3*  HCT 23.6* 24.6*  MCV 92.9 90.4  PLT 320 365   Cardiac  Enzymes:  Basename 11/30/11 0503  CKTOTAL 35  CKMB 2.1  CKMBINDEX --  TROPONINI <0.30   BNP: No results found for this basename: PROBNP:3 in the last 72 hours D-Dimer: No results found for this basename: DDIMER:2 in the last 72 hours CBG: No results found for this basename: GLUCAP:6 in the last 72 hours Hemoglobin A1C: No results found for this basename: HGBA1C in the last 72 hours Fasting Lipid Panel: No results found for this basename: CHOL,HDL,LDLCALC,TRIG,CHOLHDL,LDLDIRECT in the last 72 hours Thyroid Function Tests: No results found for this basename: TSH,T4TOTAL,FREET4,T3FREE,THYROIDAB in the last 72 hours Anemia Panel: No results found for this basename: VITAMINB12,FOLATE,FERRITIN,TIBC,IRON,RETICCTPCT in the last 72 hours Coagulation: No results found for this basename: LABPROT:2,INR:2 in the last 72 hours Urine Drug Screen: Drugs of Abuse  No results found for this basename: labopia, cocainscrnur, labbenz, amphetmu, thcu, labbarb    Alcohol Level: No results found for this basename: ETH:2 in the last 72 hours Urinalysis: No results found for this basename: COLORURINE:2,APPERANCEUR:2,LABSPEC:2,PHURINE:2,GLUCOSEU:2,HGBUR:2,BILIRUBINUR:2,KETONESUR:2,PROTEINUR:2,UROBILINOGEN:2,NITRITE:2,LEUKOCYTESUR:2 in the last 72 hours Misc. Labs:  ABGS  Basename 11/30/11 0445  PHART 7.461*  PO2ART 66.6*  TCO2 19.5  HCO3 20.7   CULTURES Recent Results (from the past 240 hour(s))  URINE CULTURE     Status: Normal   Collection Time   December 06, 2011 10:08 AM      Component Value Range Status Comment   Specimen Description URINE, CATHETERIZED   Final    Special Requests NONE   Final  Culture  Setup Time 02/20/20132101   Final    Colony Count >=100,000 COLONIES/ML   Final    Culture     Final    Value: PROVIDENCIA STUARTII     ENTEROCOCCUS SPECIES   Report Status 12/01/2011 FINAL   Final    Organism ID, Bacteria PROVIDENCIA STUARTII   Final    Organism ID, Bacteria ENTEROCOCCUS  SPECIES   Final   CULTURE, BLOOD (ROUTINE X 2)     Status: Normal   Collection Time   11/30/2011 11:30 AM      Component Value Range Status Comment   Specimen Description BLOOD RIGHT HAND   Final    Special Requests BOTTLES DRAWN AEROBIC ONLY 6CC BOTTLE   Final    Culture NO GROWTH 5 DAYS   Final    Report Status 12/02/2011 FINAL   Final   CULTURE, BLOOD (ROUTINE X 2)     Status: Normal   Collection Time   11/29/2011 12:00 PM      Component Value Range Status Comment   Specimen Description BLOOD LEFT HAND   Final    Special Requests BOTTLES DRAWN AEROBIC ONLY 6CC BOTTLE   Final    Culture NO GROWTH 5 DAYS   Final    Report Status 12/02/2011 FINAL   Final   MRSA PCR SCREENING     Status: Abnormal   Collection Time   12/01/2011  5:14 PM      Component Value Range Status Comment   MRSA by PCR POSITIVE (*) NEGATIVE  Final   CULTURE, BLOOD (ROUTINE X 2)     Status: Normal (Preliminary result)   Collection Time   11/29/11  8:04 AM      Component Value Range Status Comment   Specimen Description BLOOD RIGHT HAND   Final    Special Requests BOTTLES DRAWN AEROBIC AND ANAEROBIC 6CC   Final    Culture NO GROWTH 3 DAYS   Final    Report Status PENDING   Incomplete   CULTURE, BLOOD (ROUTINE X 2)     Status: Normal (Preliminary result)   Collection Time   11/29/11  9:45 AM      Component Value Range Status Comment   Specimen Description BLOOD HEMODIALYSIS CATHETER DRAWN BY RN   Final    Special Requests     Final    Value: BOTTLES DRAWN AEROBIC AND ANAEROBIC AEB=10CC ANA=6CC   Culture NO GROWTH 3 DAYS   Final    Report Status PENDING   Incomplete    Studies/Results: Ct Head Wo Contrast  11/30/2011  *RADIOLOGY REPORT*  Clinical Data: Unresponsive, respiratory failure, cardiac arrest  CT HEAD WITHOUT CONTRAST  Technique:  Contiguous axial images were obtained from the base of the skull through the vertex without contrast.  Comparison: CT 11/27/2011  Findings: Image quality degraded by mild motion.   Multiple images were repeated.  Ventricle size is normal.  Poor definition of gray and white matter may be due to diffuse cerebral edema however could be due to motion and scan technique.  Sulci are patent and similar to the prior study which would indicate that there is not severe  brain edema.  Negative for hemorrhage.  No focal infarct or mass.  Air-fluid levels in the sphenoid sinus have developed since the prior study.  Mucosal thickening is present in the maxillary and ethmoid sinuses.  IMPRESSION: Wallace Cullens white matter are poorly differentiated.  This may be due to diffuse cerebral edema such as from  global anoxia.  However, this may be technical in nature.  Continued follow-up is suggested.  Original Report Authenticated By: Camelia Phenes, M.D.    Medications:  Scheduled:   . albuterol  2.5 mg Nebulization Q4H  . antiseptic oral rinse  15 mL Mouth Rinse QID  . artificial tears   Both Eyes Q8H  . baclofen  10 mg Oral BID  . chlorhexidine  15 mL Mouth/Throat BID  . Chlorhexidine Gluconate Cloth  6 each Topical Q0600  . citalopram  40 mg Oral Daily  . enoxaparin (LOVENOX) injection  30 mg Subcutaneous Daily  . epoetin alfa  12,000 Units Intravenous 3 times weekly  . Fluticasone-Salmeterol  1 puff Inhalation Q12H  . levetiracetam  500 mg Intravenous Q8H  . loratadine  10 mg Oral Daily  . losartan  100 mg Oral Daily  . mupirocin ointment  1 application Nasal BID  . oxybutynin  5 mg Oral Daily  . pantoprazole (PROTONIX) IV  40 mg Intravenous Q24H  . piperacillin-tazobactam (ZOSYN)  IV  2.25 g Intravenous Q8H  . sodium chloride      . DISCONTD: cefTRIAXone (ROCEPHIN)  IV  1 g Intravenous Q24H  . DISCONTD: dyphylline  200 mg Oral BID  . DISCONTD: epoetin alfa  10,000 Units Intravenous Q M,W,F-HD  . DISCONTD: linezolid  600 mg Intravenous Q12H  . DISCONTD: LORazepam  0.5 mg Intravenous Q6H  . DISCONTD: potassium chloride  20 mEq Oral BID   Continuous:   . DOPamine Stopped (11/30/11 0800)    RUE:AVWUJWJ, heparin, DISCONTD: LORazepam  Assesment: She has had a cardiac arrest with ventricular fibrillation she has chronic renal failure. She has had neurological injury and is not clear how much recovery she will have. Active Problems:  Chronic kidney failure  Essential hypertension, benign  Cardiac arrest - ventricular fibrillation    Plan: Dr. Sudie Bailey and I discussed this this morning and we both feel that we should continue with current treatments at least for now and see how much recovery she gets. Her renal function actually looks a little bit better. She may be somewhat volume overloaded. I think since her going to continue with lower doing we do need to have nutritional support as well.    LOS: 5 days   Cameo Schmiesing L 12/02/2011, 7:54 AM

## 2011-12-02 NOTE — Progress Notes (Signed)
Subjective: Interval History: none.  Objective: Vital signs in last 24 hours: Temp:  [98.1 F (36.7 C)-100.7 F (38.2 C)] 99.7 F (37.6 C) (02/15 0000) Pulse Rate:  [64-88] 72  (02/15 0600) Resp:  [6-26] 15  (02/15 0600) BP: (125-167)/(32-43) 157/43 mmHg (02/15 0600) SpO2:  [93 %-100 %] 100 % (02/15 0600) FiO2 (%):  [39.8 %-99.7 %] 40.3 % (02/15 0600) Weight:  [96.8 kg (213 lb 6.5 oz)] 96.8 kg (213 lb 6.5 oz) (02/15 0500) Weight change: 0.8 kg (1 lb 12.2 oz)  Intake/Output from previous day: 02/14 0701 - 02/15 0700 In: 525 [IV Piggyback:525] Out: 802 [Urine:800; Stool:2] Intake/Output this shift:    General appearance:  patient her remained intubated and unresponsive . Presently she seems to be only responding to painful stimuli by withdrawing. Resp:  she has bilateral inspiratory crackles anteriorly. Cardio:  regular rate and rhythm3 GI:  positive bowel sound Extremities:  1+ edema.  Lab Results:  Basename 12/02/11 0421 11/30/11 0503  WBC 22.9* 29.0*  HGB 7.7* 8.3*  HCT 23.6* 24.6*  PLT 320 365   BMET:  Basename 12/02/11 0421 11/30/11 0503  NA 137 135  K 5.3* 4.9  CL 104 104  CO2 24 20  GLUCOSE 93 153*  BUN 28* 49*  CREATININE 3.35* 4.44*  CALCIUM 9.0 9.1   No results found for this basename: PTH:2 in the last 72 hours Iron Studies: No results found for this basename: IRON,TIBC,TRANSFERRIN,FERRITIN in the last 72 hours  Studies/Results: Ct Head Wo Contrast  11/30/2011  *RADIOLOGY REPORT*  Clinical Data: Unresponsive, respiratory failure, cardiac arrest  CT HEAD WITHOUT CONTRAST  Technique:  Contiguous axial images were obtained from the base of the skull through the vertex without contrast.  Comparison: CT 2011-12-14  Findings: Image quality degraded by mild motion.  Multiple images were repeated.  Ventricle size is normal.  Poor definition of gray and white matter may be due to diffuse cerebral edema however could be due to motion and scan technique.  Sulci are  patent and similar to the prior study which would indicate that there is not severe  brain edema.  Negative for hemorrhage.  No focal infarct or mass.  Air-fluid levels in the sphenoid sinus have developed since the prior study.  Mucosal thickening is present in the maxillary and ethmoid sinuses.  IMPRESSION: Wallace Cullens white matter are poorly differentiated.  This may be due to diffuse cerebral edema such as from global anoxia.  However, this may be technical in nature.  Continued follow-up is suggested.  Original Report Authenticated By: Camelia Phenes, M.D.    I have reviewed the patient's current medications.  Assessment/Plan: Problem #1 end-stage renal disease she status post hemodialysis the day before yesterday her BUN is 28 creatinine 3.35 with potassium of 5.3. And patient is due for dialysis today.  Problem #2 respiratory failure patient remains intubated Problem #3 history of her altered mental status patient has stated above slowly respond she has is to pain. Problem #4 history of sepsis she is on antibiotics she is a febrile with percent count is coming down. Problem #5 history of anemia on the Epogen H&H seems to be declining. Problem #6 history of hypertension her blood pressure seems to be somewhat better. Problem #7 history of obesity Problem #8 history of her cardiac arrest status post resuscitation. Recommendation we'll make arrangements for patient to get dialysis today We'll try to get about 3-3 and half liters if her blood pressure tolerates If her hemoglobin and  hematocrit continues to go down probably patient may require blood transfusion. Overall her prognosis is very poor. We'll DC potassium supplement.   LOS: 5 days   Skarleth Delmonico S 12/02/2011,7:15 AM

## 2011-12-02 NOTE — Progress Notes (Signed)
UR Chart Review Completed  

## 2011-12-02 NOTE — Progress Notes (Signed)
NAME:  [name], [name]              ACCOUNT NO.:  [id number]  MEDICAL RECORD NO.:  [id number]  LOCATION:                                 FACILITY:  PHYSICIAN:  Rickey Farrier A. Gerilyn Pilgrim, M.D. DATE OF BIRTH:  [birth year]  DATE OF PROCEDURE: DATE OF DISCHARGE:                                PROGRESS NOTE   The nurse reported that the patient seemed to have some improvement today.  She moved the right thigh purposefully while she was being changed.  She was weaned down on the vent for about 6 hours today.  PHYSICAL EXAMINATION:  HEENT:  Eyes were closed.  Head is normocephalic, atraumatic. NECK:  Supple. EXTREMITIES:  Significant edema of the upper extremities especially on the right side where she has an IV. MENTATION:  Eyes are closed.  No eye opening to painful stimuli today. CRANIAL NERVE:  Right pupil is 4, left is 5, they both were reactive although sluggishly.  She has significantly diminished corneal reflexes. I did see some oculocephalic reflexes today which did represent an improvement.  Gag reflex is present and she is more responsive.  Painful stimuli, today she actually has painful withdrawal bilaterally of upper extremities.  ASSESSMENT AND PLAN:  Severe encephalopathy.  There seems to be a mixed picture with some depression of some of her reflexes, but improvement in motor function.  The severe encephalopathy again is multifactorial including urosepsis, wound infection and anoxic brain injury.  She seems to have had some mild to modest improvement in neurological function. However, I think I want to hold the Ativan for the next couple of days and reassess her neurologically.  Her myoclonic jerks have stopped and I think this will be reason to hold the Ativan, we will continue with the Keppra however.     [name] A. Gerilyn Pilgrim, M.D.     KAD/MEDQ  D:  12/01/2011  T:  12/01/2011  Job:  [id number]

## 2011-12-02 NOTE — Progress Notes (Signed)
2050-Notified MD of low BP-80/20 and MD ordered a one time bolus of 250 ml of 0.9% NS.

## 2011-12-02 NOTE — Progress Notes (Signed)
NAME:  [name], [name]              ACCOUNT NO.:  [id number]  MEDICAL RECORD NO.:  [id number]  LOCATION:  IC05                          FACILITY:  [facility]  PHYSICIAN:  Ashaunte Standley A. Gerilyn Pilgrim, M.D. DATE OF BIRTH:  18-Jan-1930  DATE OF PROCEDURE: DATE OF DISCHARGE:                                PROGRESS NOTE   Nurse reported that the patient seemed to be about the same, but she has had some movements on the right side, especially within the lower lobe recently in the 8 to 9 range, otherwise been stable all over the sides even the low was 50, systolic in the highest 160-167.  PHYSICAL EXAMINATION:  GENERAL: Today, again she is intubated. NECK:  Supple HEAD:  Normocephalic. ABDOMEN:  Soft. EXTREMITIES:  Continues to have significant edema of upper extremities, especially on the right side. MENTATION:  She is lying in bed with her eyes closed.  She does have partial eye opening to deep painful stimuli. She gags and coughs and clinches, moves both upper extremities symmetrically and withdrawals from pain of the upper extremities. Corneal reflexes are present but somewhat down. She has somewhat positive oculocephalic reflexes.  Pupils are midposition and reactive.  ASSESSMENT AND PLAN:  Severe encephalopathy.  She is improving.  I think we should continue holding her Ativan and see how she does in the next couple of days with neurological functions.  Again, her baseline medical problems are probably the primary determinants for poor prognosis.  I will continue to follow the patient.     Cydnie Deason A. Gerilyn Pilgrim, M.D.     KAD/MEDQ  D:  12/02/2011  T:  12/02/2011  Job:  [id number]

## 2011-12-02 NOTE — Progress Notes (Signed)
NAMEJENNAH, [name]              ACCOUNT NO.:  [id number]  MEDICAL RECORD NO.:  [id number]  LOCATION:                                 FACILITY:  PHYSICIAN:  Mila Homer. Sudie Bailey, M.D.DATE OF BIRTH:  [birth year]  DATE OF PROCEDURE: DATE OF DISCHARGE:                                PROGRESS NOTE   SUBJECTIVE:  Still not talking.  She did respond to painful stimuli yesterday and was off the vent for about 30 seconds, with spontaneous respiration, but her respiratory rate went up to about 30 a minute with that, so she went back on the vent, according to Dr. Juanetta Gosling, her pulmonologist, with whom I talked today.  OBJECTIVE:  VITAL SIGNS:  Temperature is 99.7, pulse 72, respiratory rate 16, blood pressure 157/43. GENERAL:  Found today in the ICU.  Color is somewhat pale. LUNGS:  Sound fairly clear. HEART:  Regular rhythm. EXTREMITIES:  She has a little puffiness of her hands.  LABORATORY DATA:  Her white cell count today has dropped to 22,900.  Her hemoglobin is 7.7.  Her BMP showed a potassium 5.3, BUN 48, creatinine 3.35.  ASSESSMENT: 1. Anoxic encephalopathy, status post cardiopulmonary arrest. 2. Urinary tract infection secondary to Providencia stuartii and     enterococcus species. 3. Chronic renal insufficiency, requiring hemodialysis 3 times a week. 4. Chronic infection of the left knee, now status post 2 total knee     surgeries with removal hardware, now with a spacing device.  PLAN:  I discussed with Dr. Juanetta Gosling, as above.  At this point, she is showing some neurological improvement, albeit it is minimal.  Will continue with the vent and monitor neurological status at this point. She is followed by Dr. Gerilyn Pilgrim, neurologist.     Mila Homer. Sudie Bailey, M.D.     SDK/MEDQ  D:  12/02/2011  T:  12/02/2011  Job:  [id number]

## 2011-12-02 NOTE — Progress Notes (Signed)
Right a.c. Iv infiltrated this am. Multiple attempts to gain access were unsuccessful, by multiple staff. One dose of zosyn given at the end of dialysis process. Dr Sudie Bailey notified at 1400 of above. Due to pt's poor prognosis Dr Sudie Bailey gave order to access hemodialysis line, rather than attempt PICC or central line. Heparin fills were removed from both dialysis line ports, then flushed with 10 c.c. normal saline prior to use. Patient has been visited by multiple family members. Sons [name] and [name] voiced multiple questions, and voiced understanding of pt's status.

## 2011-12-03 LAB — BASIC METABOLIC PANEL
CO2: 26 mEq/L (ref 19–32)
Calcium: 8.5 mg/dL (ref 8.4–10.5)
GFR calc non Af Amer: 17 mL/min — ABNORMAL LOW (ref >90)
Glucose, Bld: 181 mg/dL — ABNORMAL HIGH (ref 70–99)
Potassium: 3.8 mEq/L (ref 3.5–5.1)
Sodium: 140 mEq/L (ref 135–145)

## 2011-12-03 LAB — CBC
Hemoglobin: 7.9 g/dL — ABNORMAL LOW (ref 12.0–15.0)
MCH: 30.5 pg (ref 26.0–34.0)
MCV: 93.4 fL (ref 78.0–100.0)
Platelets: 304 10*3/uL (ref 150–400)
RBC: 2.59 MIL/uL — ABNORMAL LOW (ref 3.87–5.11)
WBC: 20.4 10*3/uL — ABNORMAL HIGH (ref 4.0–10.5)

## 2011-12-03 MED ORDER — ACETAMINOPHEN 650 MG RE SUPP
650.0000 mg | RECTAL | Status: DC | PRN
  Administered 2011-12-03 (×2): 650 mg via RECTAL
  Filled 2011-12-03 (×2): qty 1

## 2011-12-03 NOTE — Progress Notes (Signed)
Subjective: Interval History: none.  Objective: Vital signs in last 24 hours: Temp:  [97.8 F (36.6 C)-101.3 F (38.5 C)] 99.3 F (37.4 C) (02/16 0600) Pulse Rate:  [67-98] 72  (02/16 0600) Resp:  [10-28] 15  (02/16 0600) BP: (56-166)/(13-65) 108/36 mmHg (02/16 0600) SpO2:  [93 %-100 %] 100 % (02/16 0600) FiO2 (%):  [39.6 %-40.4 %] 40.3 % (02/16 0600) Weight:  [93.5 kg (206 lb 2.1 oz)-96.8 kg (213 lb 6.5 oz)] 93.9 kg (207 lb 0.2 oz) (02/16 0500) Weight change: 0 kg (0 lb)  Intake/Output from previous day: 02/15 0701 - 02/16 0700 In: 825 [NG/GT:360; IV Piggyback:465] Out: 3775 [Urine:275] Intake/Output this shift:    General appearance: patient her presently he is still intubated and somolent.. Patient however responds to pain only.  Resp:  Decreased breath sound bilaterally. Heart  regular rate and rhythm distant S1 GI:  full ,none tender  Extremities: She'll no edema.  Lab Results:  Basename 12/03/11 0503 12/02/11 0421  WBC 20.4* 22.9*  HGB 7.9* 7.7*  HCT 24.2* 23.6*  PLT 304 320   BMET:  Basename 12/03/11 0503 12/02/11 0421  NA 140 137  K 3.8 5.3*  CL 103 104  CO2 26 24  GLUCOSE 181* 93  BUN 21 28*  CREATININE 2.53* 3.35*  CALCIUM 8.5 9.0   No results found for this basename: PTH:2 in the last 72 hours Iron Studies: No results found for this basename: IRON,TIBC,TRANSFERRIN,FERRITIN in the last 72 hours  Studies/Results: No results found.  I have reviewed the patient's current medications.  Assessment/Plan:  problem #1 end-stage renal disease she status post hemodialysis yesterday her BUN is 21 creatinine of 2.53 with potassium of 3.8 stable. Problem #2 sepsis: at this moment seems to be secondary to gram positive cocci. Patient is still with fever and the leukocytosis however seems to be improving . She is on Zyvox. Problem #3 anemia her hemoglobin and hematocrit is stable Problem #4 hypotension blood pressure seems to be somewhat better. Problem #5  status post cardiac arrest Problem #6 history of altered mental status Problem #7 respiratory failure patient still remains intubated. Plan: Discussed with her daughter at this moment we may need to take out the catheter since the patient still has intermittent fever. We'll check her CBC and basic metabolic panel tomorrow Will her try to get femoral catheter for hemodialysis possibly on Monday.     LOS: 6 days   Moises Terpstra S 12/03/2011,7:53 AM

## 2011-12-03 NOTE — Progress Notes (Signed)
[name], [name]              ACCOUNT NO.:  [id number]  MEDICAL RECORD NO.:  [id number]  LOCATION:  IC05                          FACILITY:  [facility]  PHYSICIAN:  Mila Homer. Sudie Bailey, M.D.DATE OF BIRTH:  [birth year]  DATE OF PROCEDURE: DATE OF DISCHARGE:                                PROGRESS NOTE   SUBJECTIVE:  She is in the ICU on a vent.  I talked to nursing and her daughter.  The patient is off all sedating drugs.  Apparently, she still is moving to noxious stimuli and also moving her feet at times.  OBJECTIVE:  VITAL SIGNS:  Temperature is 100.3, pulse 86, respiratory rate 18, blood pressure 115/24. GENERAL:  She is supine in bed, on a vent. LUNGS:  Her lungs appear to be clear and she is moving air well with the help of the ventilator. HEART:  Her heart has a regular rhythm.  Rate of about 80. NEURO:  While I am standing and talking to her daughter and son-in-law, she spontaneously moves both feet, flexing and extending them.  She is unable to follow any commands I gave her, however.  LABS:  Today's BUN was 21, creatinine 2.53.  White cell count 20,400, hemoglobin 7.9. One blood culture was positive for gram-positive cocci in clusters.  ASSESSMENT: 1. Anoxic encephalopathy, status post cardiopulmonary arrest. 2. Urinary tract infection secondary to Providencia Stuartii and     enterococcus species. 3. Gram-positive bacteremia. 4. Chronic renal insufficiency requiring hemodialysis 3 times a week. 5. Chronic infection of left knee, now status post 2 total knee     surgeries and removal of the hardware with a spacing device in the     left knee. 6. IV access difficulty.  PLAN:  We initially used her dialysis access for IVs the last day, but nursing is still unable to start an IV and since we are not sure about this patient's course we have asked surgery to place a central line.  Her prognosis is still very grim, but she does have some mild neurological improvement.  I  discussed all this with the patient's daughter.    Mila Homer. Sudie Bailey, M.D.    SDK/MEDQ  D:  12/03/2011  T:  12/03/2011  Job:  [id number]

## 2011-12-03 NOTE — Progress Notes (Signed)
Dr Leticia Penna in to see pt prior to planned hemodialysis catheter removal, and subsequent central line placement. Dr Leticia Penna informed of plan to do terminal wean tommorow, and decision was made to contact Dr Juanetta Gosling prior to any access intervention. Message was left for Dr Juanetta Gosling with Dr Illene Regulus cell phone number.

## 2011-12-03 NOTE — Progress Notes (Signed)
Subjective: There is no significant change except she's somewhat more hemodynamically unstable. She's had more fever. She has a positive blood culture. There no neurological changes  Objective: Vital signs in last 24 hours: Temp:  [97.8 F (36.6 C)-101.3 F (38.5 C)] 100.3 F (37.9 C) (02/16 0800) Pulse Rate:  [67-98] 88  (02/16 0845) Resp:  [14-28] 15  (02/16 0845) BP: (56-166)/(13-89) 126/30 mmHg (02/16 0845) SpO2:  [93 %-100 %] 100 % (02/16 0845) FiO2 (%):  [39.6 %-40.3 %] 39.7 % (02/16 0845) Weight:  [93.5 kg (206 lb 2.1 oz)-96.8 kg (213 lb 6.5 oz)] 93.9 kg (207 lb 0.2 oz) (02/16 0500) Weight change: 0 kg (0 lb) Last BM Date: 12/03/11  Intake/Output from previous day: 02/15 0701 - 02/16 0700 In: 915 [NG/GT:450; IV Piggyback:465] Out: 3775 [Urine:275]  PHYSICAL EXAM General appearance: She is intubated and unresponsive Resp: rhonchi bilaterally Cardio: regular rate and rhythm, S1, S2 normal, no murmur, click, rub or gallop GI: soft, non-tender; bowel sounds normal; no masses,  no organomegaly Extremities: Significant third spacing of fluid  Lab Results:    Basic Metabolic Panel:  Basename 12/03/11 0503 12/02/11 0421  NA 140 137  K 3.8 5.3*  CL 103 104  CO2 26 24  GLUCOSE 181* 93  BUN 21 28*  CREATININE 2.53* 3.35*  CALCIUM 8.5 9.0  MG -- --  PHOS -- --   Liver Function Tests: No results found for this basename: AST:2,ALT:2,ALKPHOS:2,BILITOT:2,PROT:2,ALBUMIN:2 in the last 72 hours No results found for this basename: LIPASE:2,AMYLASE:2 in the last 72 hours No results found for this basename: AMMONIA:2 in the last 72 hours CBC:  Basename 12/03/11 0503 12/02/11 0421  WBC 20.4* 22.9*  NEUTROABS -- --  HGB 7.9* 7.7*  HCT 24.2* 23.6*  MCV 93.4 92.9  PLT 304 320   Cardiac Enzymes: No results found for this basename: CKTOTAL:3,CKMB:3,CKMBINDEX:3,TROPONINI:3 in the last 72 hours BNP: No results found for this basename: PROBNP:3 in the last 72  hours D-Dimer: No results found for this basename: DDIMER:2 in the last 72 hours CBG: No results found for this basename: GLUCAP:6 in the last 72 hours Hemoglobin A1C: No results found for this basename: HGBA1C in the last 72 hours Fasting Lipid Panel: No results found for this basename: CHOL,HDL,LDLCALC,TRIG,CHOLHDL,LDLDIRECT in the last 72 hours Thyroid Function Tests: No results found for this basename: TSH,T4TOTAL,FREET4,T3FREE,THYROIDAB in the last 72 hours Anemia Panel: No results found for this basename: VITAMINB12,FOLATE,FERRITIN,TIBC,IRON,RETICCTPCT in the last 72 hours Coagulation: No results found for this basename: LABPROT:2,INR:2 in the last 72 hours Urine Drug Screen: Drugs of Abuse  No results found for this basename: labopia, cocainscrnur, labbenz, amphetmu, thcu, labbarb    Alcohol Level: No results found for this basename: ETH:2 in the last 72 hours Urinalysis: No results found for this basename: COLORURINE:2,APPERANCEUR:2,LABSPEC:2,PHURINE:2,GLUCOSEU:2,HGBUR:2,BILIRUBINUR:2,KETONESUR:2,PROTEINUR:2,UROBILINOGEN:2,NITRITE:2,LEUKOCYTESUR:2 in the last 72 hours Misc. Labs:  ABGS No results found for this basename: PHART,PCO2,PO2ART,TCO2,HCO3 in the last 72 hours CULTURES Recent Results (from the past 240 hour(s))  URINE CULTURE     Status: Normal   Collection Time   11/30/2011 10:08 AM      Component Value Range Status Comment   Specimen Description URINE, CATHETERIZED   Final    Special Requests NONE   Final    Culture  Setup Time [id number]   Final    Colony Count >=100,000 COLONIES/ML   Final    Culture     Final    Value: PROVIDENCIA STUARTII     ENTEROCOCCUS SPECIES   Report  Status 12/01/2011 FINAL   Final    Organism ID, Bacteria PROVIDENCIA STUARTII   Final    Organism ID, Bacteria ENTEROCOCCUS SPECIES   Final   CULTURE, BLOOD (ROUTINE X 2)     Status: Normal   Collection Time   2011-12-03 11:30 AM      Component Value Range Status Comment   Specimen  Description BLOOD RIGHT HAND   Final    Special Requests BOTTLES DRAWN AEROBIC ONLY 6CC BOTTLE   Final    Culture NO GROWTH 5 DAYS   Final    Report Status 12/02/2011 FINAL   Final   CULTURE, BLOOD (ROUTINE X 2)     Status: Normal   Collection Time   12-03-2011 12:00 PM      Component Value Range Status Comment   Specimen Description BLOOD LEFT HAND   Final    Special Requests BOTTLES DRAWN AEROBIC ONLY 6CC BOTTLE   Final    Culture NO GROWTH 5 DAYS   Final    Report Status 12/02/2011 FINAL   Final   MRSA PCR SCREENING     Status: Abnormal   Collection Time   12/03/2011  5:14 PM      Component Value Range Status Comment   MRSA by PCR POSITIVE (*) NEGATIVE  Final   CULTURE, BLOOD (ROUTINE X 2)     Status: Normal (Preliminary result)   Collection Time   11/29/11  8:04 AM      Component Value Range Status Comment   Specimen Description BLOOD RIGHT HAND   Final    Special Requests BOTTLES DRAWN AEROBIC AND ANAEROBIC [facility]   Final    Culture  Setup Time [id number]   Final    Culture     Final    Value: GRAM POSITIVE COCCI IN CLUSTERS     Note: Gram Stain Report Called to,Read Back By and Verified With: PHILLIPS C. @1648  ON 12/02/11 BY Luetta Nutting M. Performed at [facility]   Report Status PENDING   Incomplete   CULTURE, BLOOD (ROUTINE X 2)     Status: Normal (Preliminary result)   Collection Time   11/29/11  9:45 AM      Component Value Range Status Comment   Specimen Description BLOOD HEMODIALYSIS CATHETER DRAWN BY RN   Final    Special Requests     Final    Value: BOTTLES DRAWN AEROBIC AND ANAEROBIC AEB=10CC ANA=6CC   Culture  Setup Time [id number]   Final    Culture     Final    Value: GRAM POSITIVE COCCI IN CLUSTERS     Note: Gram Stain Report Called to,Read Back By and Verified With: BAILEY A. @1213PM  ON 12/02/11 BY PRUITT  C. Performed at [facility]   Report Status PENDING   Incomplete    Studies/Results: No results found.  Medications:  Scheduled:   .  albuterol  2.5 mg Nebulization Q4H  . antiseptic oral rinse  15 mL Mouth Rinse QID  . artificial tears   Both Eyes Q8H  . baclofen  10 mg Oral BID  . chlorhexidine  15 mL Mouth/Throat BID  . citalopram  40 mg Oral Daily  . enoxaparin (LOVENOX) injection  30 mg Subcutaneous Daily  . epoetin alfa  12,000 Units Intravenous 3 times weekly  . Fluticasone-Salmeterol  1 puff Inhalation Q12H  . levetiracetam  500 mg Intravenous Q8H  . loratadine  10 mg Oral Daily  . losartan  100 mg Oral Daily  .  mupirocin ointment  1 application Nasal BID  . oxybutynin  5 mg Oral Daily  . pantoprazole (PROTONIX) IV  40 mg Intravenous Q24H  . piperacillin-tazobactam (ZOSYN)  IV  2.25 g Intravenous Q8H  . sodium chloride  250 mL Intravenous Once  . sodium chloride      . sodium chloride      . sodium chloride      . sodium chloride       Continuous:   . DOPamine Stopped (11/30/11 0800)  . feeding supplement (JEVITY 1.2) 1,000 mL (12/02/11 1128)   WUJ:[id number], heparin, heparin, sodium chloride  Assesment: She has had a cardiopulmonary arrest and has significant brain injury. I had another long discussion with family this morning and they want to discuss withdrawal of life support probably for tomorrow. This another family member they would like to get here first. I explained how withdrawal of life support is done and what their other options are but they feel strongly that their mother would not want to be in this situation. Active Problems:  Chronic kidney failure  Essential hypertension, benign  Cardiac arrest - ventricular fibrillation    Plan: Potential withdrawal of life support tomorrow    LOS: 6 days   Chera Slivka L 12/03/2011, 9:24 AM

## 2011-12-04 LAB — CBC
HCT: 24.9 % — ABNORMAL LOW (ref 36.0–46.0)
MCH: 30.7 pg (ref 26.0–34.0)
MCV: 94.3 fL (ref 78.0–100.0)
RBC: 2.64 MIL/uL — ABNORMAL LOW (ref 3.87–5.11)
WBC: 20.8 10*3/uL — ABNORMAL HIGH (ref 4.0–10.5)

## 2011-12-04 LAB — CULTURE, BLOOD (ROUTINE X 2)

## 2011-12-04 LAB — BASIC METABOLIC PANEL
CO2: 26 mEq/L (ref 19–32)
Calcium: 8.8 mg/dL (ref 8.4–10.5)
Chloride: 105 mEq/L (ref 96–112)
Glucose, Bld: 153 mg/dL — ABNORMAL HIGH (ref 70–99)
Sodium: 140 mEq/L (ref 135–145)

## 2011-12-04 MED ORDER — MIDAZOLAM HCL 50 MG/10ML IJ SOLN
INTRAMUSCULAR | Status: AC
  Filled 2011-12-04: qty 1

## 2011-12-04 MED ORDER — MIDAZOLAM BOLUS VIA INFUSION
5.0000 mg | INTRAVENOUS | Status: DC | PRN
  Administered 2011-12-04: 10 mg via INTRAVENOUS
  Filled 2011-12-04: qty 20

## 2011-12-04 MED ORDER — MORPHINE SULFATE 25 MG/ML IV SOLN
10.0000 mg/h | INTRAVENOUS | Status: DC
  Administered 2011-12-04: 10 mg/h via INTRAVENOUS

## 2011-12-04 MED ORDER — MORPHINE BOLUS VIA INFUSION
5.0000 mg | INTRAVENOUS | Status: DC | PRN
  Administered 2011-12-04 (×2): 10 mg via INTRAVENOUS
  Filled 2011-12-04: qty 20

## 2011-12-04 MED ORDER — SCOPOLAMINE 1 MG/3DAYS TD PT72
1.0000 | MEDICATED_PATCH | TRANSDERMAL | Status: DC
  Administered 2011-12-04: 1.5 mg via TRANSDERMAL
  Filled 2011-12-04: qty 1

## 2011-12-04 MED ORDER — SODIUM CHLORIDE 0.9 % IV SOLN
10.0000 mg/h | INTRAVENOUS | Status: DC
  Administered 2011-12-04 (×2): 12.5 mg/h via INTRAVENOUS
  Administered 2011-12-04 – 2011-12-05 (×2): 10 mg/h via INTRAVENOUS
  Filled 2011-12-04 (×2): qty 10

## 2011-12-04 NOTE — Procedures (Signed)
Patient's oral and ETT were suctioned. Patient extubated to 35% VM at 1155 HR 82; SpO2 90%; respiatory rate 26.

## 2011-12-04 NOTE — Progress Notes (Signed)
Pt extubated. Versed and morphine currently infusing for comfort. Pt shows no signs of discomfort, and is surrounded by family.

## 2011-12-04 NOTE — Progress Notes (Signed)
Patient discussed with nursing this a.m.  Terminal wean in progress.Social Worker is available to provide support to patient/family as needed.  Darylene Price, BSW, 12/04/2011 11:07 AM  Weekend coverage.

## 2011-12-04 NOTE — Progress Notes (Signed)
[name], [name]              ACCOUNT NO.:  [id number]  MEDICAL RECORD NO.:  [id number]  LOCATION:  IC05                          FACILITY:  [facility]  PHYSICIAN:  Mila Homer. Sudie Bailey, M.D.DATE OF BIRTH:  1930/07/12  DATE OF PROCEDURE:  12/04/2011 DATE OF DISCHARGE:                                PROGRESS NOTE   She continues in the ICU on a vent.  She is on no sedation.  Nursing tells me she will occasionally move her feet.  OBJECTIVE:  Temperature 98.3, pulse 82, respiratory rate 15, blood pressure 135/21.  She is supine in bed.  She is on the vent.  Her lung sounds are clear throughout and her heart has a regular rhythm, rate of about 80.  She occasionally moves the upper chest and extremities, but there is no foot movement today.  There is no response from her even though she is unsedated and has been for over a week.  ASSESSMENT: 1. Anoxic encephalopathy, status post cardiopulmonary arrest. 2. Urinary tract infection secondary to Providencia stuartii and     enterococcus faecium. 3. Gram-positive bacteremia with staphylococcus species.  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 4. Renal insufficiency on hemodialysis 3 times a week. 5. Chronic infection of the left knee, now status post 2 total knee     surgeries, removal of the hardware with station device.  PLAN:  Dr. Juanetta Gosling and Dr. Gerilyn Pilgrim have discussed with the family.  She will be started on a terminal wean today.  I was unable to find a family member to discuss this with today, but I believe that all efforts have been made to make sure that she returns to neurological stability and obviously, she still has not done this.  She is ventilator-dependent at present.     Mila Homer. Sudie Bailey, M.D.     SDK/MEDQ  D:  12/04/2011  T:  12/04/2011  Job:  [id number]

## 2011-12-04 NOTE — Progress Notes (Signed)
Subjective: Interval History: none.  Objective: Vital signs in last 24 hours: Temp:  [97.9 F (36.6 C)-101.2 F (38.4 C)] 98.3 F (36.8 C) (02/17 0400) Pulse Rate:  [69-86] 82  (02/17 0600) Resp:  [14-24] 15  (02/17 0600) BP: (81-135)/(15-49) 135/21 mmHg (02/17 0600) SpO2:  [93 %-100 %] 100 % (02/17 0815) FiO2 (%):  [39.7 %-40.3 %] 40 % (02/17 0815) Weight:  [93.577 kg (206 lb 4.8 oz)] 93.577 kg (206 lb 4.8 oz) (02/17 0400) Weight change: -3.223 kg (-7 lb 1.7 oz)  Intake/Output from previous day: 02/16 0701 - 02/17 0700 In: 685 [NG/GT:480; IV Piggyback:205] Out: 275 [Urine:275] Intake/Output this shift:    Patient her intubated and also she has is still unresponsive. Chest she has inspiratory crackles bilaterally Heart exam regular rate and rhythm Extremities she has 1+ edema.  Lab Results:  Basename 12/04/11 0509 12/03/11 0503  WBC 20.8* 20.4*  HGB 8.1* 7.9*  HCT 24.9* 24.2*  PLT 290 304   BMET:  Basename 12/04/11 0509 12/03/11 0503  NA 140 140  K 4.0 3.8  CL 105 103  CO2 26 26  GLUCOSE 153* 181*  BUN 31* 21  CREATININE 3.31* 2.53*  CALCIUM 8.8 8.5   No results found for this basename: PTH:2 in the last 72 hours Iron Studies: No results found for this basename: IRON,TIBC,TRANSFERRIN,FERRITIN in the last 72 hours  Studies/Results: No results found.  I have reviewed the patient's current medications.  Assessment/Plan:  Problem #1 end-stage renal disease she status post hemodialysis on Friday her BUN is 31 creatinine 3.31 potassium is 4 which is good. Problem #2 history of her sepsis presently patient on antibiotics her white blood cell count remains high with some fever. Presently family's seems to have a great to try to withdraw life-support and because of that the hemodialysis cath was not removed. Problem #3 history of respiratory failure she remains intubated Problem #4 history of arthritis Problem #5 history of anemia Problem #6 history of cardiac  arrest Problem #7 history of obesity. Recommendation: At this moment patient was poor prognosis and the I agree with family and Dr. Juanetta Gosling decision.    LOS: 7 days   Carle Fenech S 12/04/2011,8:53 AM

## 2011-12-04 NOTE — Procedures (Signed)
Extubation Procedure Note  Patient Details:   Name: [name] DOB: 09-19-1930 MRN: [id number]   Airway Documentation:  Airway (Active)  Secured at (cm) 22 cm 12/04/2011 11:23 AM  Measured From Lips 12/04/2011 11:23 AM  Secured Location Left 12/04/2011  8:00 AM  Secured By [employer] 12/04/2011 11:23 AM  Tube Holder Repositioned Yes 12/04/2011  5:11 AM  Cuff Pressure (cm H2O) 32 cm H2O 12/04/2011  5:11 AM  Site Condition Dry 12/04/2011 11:23 AM    Evaluation  O2 sats: stable throughout Complications: No apparent complications Patient did tolerate procedure well. Bilateral Breath Sounds: Diminished Suctioning: Oral No  Lina Sayre 12/04/2011, 11:57 AM

## 2011-12-04 NOTE — Progress Notes (Signed)
Subjective: I discussed her situation with her family again and they do wish to proceed with terminal weaning today. They want to withdraw life support.  I have discussed this at length with them on multiple occasions and they are all in agreement. There seems to of been no change in the last 48 hours.  Objective: Vital signs in last 24 hours: Temp:  [97.9 F (36.6 C)-101.2 F (38.4 C)] 98.3 F (36.8 C) (02/17 0400) Pulse Rate:  [69-103] 103  (02/17 0930) Resp:  [14-24] 18  (02/17 0930) BP: (81-139)/(15-49) 125/33 mmHg (02/17 0900) SpO2:  [93 %-100 %] 100 % (02/17 0930) FiO2 (%):  [39.7 %-40.3 %] 40.2 % (02/17 0930) Weight:  [93.577 kg (206 lb 4.8 oz)] 93.577 kg (206 lb 4.8 oz) (02/17 0400) Weight change: -3.223 kg (-7 lb 1.7 oz) Last BM Date: 12/03/11  Intake/Output from previous day: 02/16 0701 - 02/17 0700 In: 685 [NG/GT:480; IV Piggyback:205] Out: 275 [Urine:275]  PHYSICAL EXAM General appearance: No real purposeful response now Resp: rhonchi bilaterally Cardio: regular rate and rhythm, S1, S2 normal, no murmur, click, rub or gallop GI: soft, non-tender; bowel sounds normal; no masses,  no organomegaly Extremities: extremities normal, atraumatic, no cyanosis or edema  Lab Results:    Basic Metabolic Panel:  Basename 12/04/11 0509 12/03/11 0503  NA 140 140  K 4.0 3.8  CL 105 103  CO2 26 26  GLUCOSE 153* 181*  BUN 31* 21  CREATININE 3.31* 2.53*  CALCIUM 8.8 8.5  MG -- --  PHOS -- --   Liver Function Tests: No results found for this basename: AST:2,ALT:2,ALKPHOS:2,BILITOT:2,PROT:2,ALBUMIN:2 in the last 72 hours No results found for this basename: LIPASE:2,AMYLASE:2 in the last 72 hours No results found for this basename: AMMONIA:2 in the last 72 hours CBC:  Basename 12/04/11 0509 12/03/11 0503  WBC 20.8* 20.4*  NEUTROABS -- --  HGB 8.1* 7.9*  HCT 24.9* 24.2*  MCV 94.3 93.4  PLT 290 304   Cardiac Enzymes: No results found for this basename:  CKTOTAL:3,CKMB:3,CKMBINDEX:3,TROPONINI:3 in the last 72 hours BNP: No results found for this basename: PROBNP:3 in the last 72 hours D-Dimer: No results found for this basename: DDIMER:2 in the last 72 hours CBG: No results found for this basename: GLUCAP:6 in the last 72 hours Hemoglobin A1C: No results found for this basename: HGBA1C in the last 72 hours Fasting Lipid Panel: No results found for this basename: CHOL,HDL,LDLCALC,TRIG,CHOLHDL,LDLDIRECT in the last 72 hours Thyroid Function Tests: No results found for this basename: TSH,T4TOTAL,FREET4,T3FREE,THYROIDAB in the last 72 hours Anemia Panel: No results found for this basename: VITAMINB12,FOLATE,FERRITIN,TIBC,IRON,RETICCTPCT in the last 72 hours Coagulation: No results found for this basename: LABPROT:2,INR:2 in the last 72 hours Urine Drug Screen: Drugs of Abuse  No results found for this basename: labopia, cocainscrnur, labbenz, amphetmu, thcu, labbarb    Alcohol Level: No results found for this basename: ETH:2 in the last 72 hours Urinalysis: No results found for this basename: COLORURINE:2,APPERANCEUR:2,LABSPEC:2,PHURINE:2,GLUCOSEU:2,HGBUR:2,BILIRUBINUR:2,KETONESUR:2,PROTEINUR:2,UROBILINOGEN:2,NITRITE:2,LEUKOCYTESUR:2 in the last 72 hours Misc. Labs:  ABGS No results found for this basename: PHART,PCO2,PO2ART,TCO2,HCO3 in the last 72 hours CULTURES Recent Results (from the past 240 hour(s))  URINE CULTURE     Status: Normal   Collection Time   11/19/2011 10:08 AM      Component Value Range Status Comment   Specimen Description URINE, CATHETERIZED   Final    Special Requests NONE   Final    Culture  Setup Time [id number]   Final    Colony Count >=100,000  COLONIES/ML   Final    Culture     Final    Value: PROVIDENCIA STUARTII     ENTEROCOCCUS SPECIES   Report Status 12/01/2011 FINAL   Final    Organism ID, Bacteria PROVIDENCIA STUARTII   Final    Organism ID, Bacteria ENTEROCOCCUS SPECIES   Final   CULTURE, BLOOD  (ROUTINE X 2)     Status: Normal   Collection Time   12/03/11 11:30 AM      Component Value Range Status Comment   Specimen Description BLOOD RIGHT HAND   Final    Special Requests BOTTLES DRAWN AEROBIC ONLY 6CC BOTTLE   Final    Culture NO GROWTH 5 DAYS   Final    Report Status 12/02/2011 FINAL   Final   CULTURE, BLOOD (ROUTINE X 2)     Status: Normal   Collection Time   Dec 03, 2011 12:00 PM      Component Value Range Status Comment   Specimen Description BLOOD LEFT HAND   Final    Special Requests BOTTLES DRAWN AEROBIC ONLY 6CC BOTTLE   Final    Culture NO GROWTH 5 DAYS   Final    Report Status 12/02/2011 FINAL   Final   MRSA PCR SCREENING     Status: Abnormal   Collection Time   12/03/2011  5:14 PM      Component Value Range Status Comment   MRSA by PCR POSITIVE (*) NEGATIVE  Final   CULTURE, BLOOD (ROUTINE X 2)     Status: Normal (Preliminary result)   Collection Time   11/29/11  8:04 AM      Component Value Range Status Comment   Specimen Description BLOOD RIGHT HAND   Final    Special Requests BOTTLES DRAWN AEROBIC AND ANAEROBIC [facility]   Final    Culture  Setup Time [id number]   Final    Culture     Final    Value: STAPHYLOCOCCUS SPECIES     Note: Gram Stain Report Called to,Read Back By and Verified With: PHILLIPS C. @1648  ON 12/02/11 BY Luetta Nutting M. Performed at [facility]   Report Status PENDING   Incomplete   CULTURE, BLOOD (ROUTINE X 2)     Status: Normal (Preliminary result)   Collection Time   11/29/11  9:45 AM      Component Value Range Status Comment   Specimen Description BLOOD HEMODIALYSIS CATHETER DRAWN BY RN   Final    Special Requests     Final    Value: BOTTLES DRAWN AEROBIC AND ANAEROBIC AEB=10CC ANA=6CC   Culture  Setup Time [id number]   Final    Culture     Final    Value: STAPHYLOCOCCUS SPECIES     Note: RIFAMPIN AND GENTAMICIN SHOULD NOT BE USED AS SINGLE DRUGS FOR TREATMENT OF STAPH INFECTIONS.     Note: Gram Stain Report Called to,Read Back By and  Verified With: BAILEY A. @1213PM  ON 12/02/11 BY PRUITT  C. Performed at [facility]   Report Status PENDING   Incomplete    Studies/Results: No results found.  Medications:  Scheduled:   . albuterol  2.5 mg Nebulization Q4H  . antiseptic oral rinse  15 mL Mouth Rinse QID  . artificial tears   Both Eyes Q8H  . baclofen  10 mg Oral BID  . chlorhexidine  15 mL Mouth/Throat BID  . citalopram  40 mg Oral Daily  . enoxaparin (LOVENOX) injection  30 mg Subcutaneous Daily  .  epoetin alfa  12,000 Units Intravenous 3 times weekly  . Fluticasone-Salmeterol  1 puff Inhalation Q12H  . levetiracetam  500 mg Intravenous Q8H  . loratadine  10 mg Oral Daily  . losartan  100 mg Oral Daily  . oxybutynin  5 mg Oral Daily  . pantoprazole (PROTONIX) IV  40 mg Intravenous Q24H  . piperacillin-tazobactam (ZOSYN)  IV  2.25 g Intravenous Q8H   Continuous:   . DOPamine Stopped (11/30/11 0800)  . feeding supplement (JEVITY 1.2) 1,000 mL (12/03/11 1731)   JWJ:[id number], heparin, heparin, sodium chloride  Assesment: She is no better. After discussion with the family they feel that she was very unhappy with the way her life was before this acute event and that she did not want to be intubated and on a ventilator for any appreciable time. They want to proceed with termination of life support. I think that's reasonable decision considering the circumstances Active Problems:  Chronic kidney failure  Essential hypertension, benign  Cardiac arrest - ventricular fibrillation    Plan: Termination of life support    LOS: 7 days   [name] 12/04/2011, 9:39 AM

## 2011-12-05 MED ORDER — MIDAZOLAM HCL 50 MG/10ML IJ SOLN
INTRAMUSCULAR | Status: AC
  Filled 2011-12-05: qty 1

## 2011-12-16 NOTE — Progress Notes (Signed)
0135-Pt pulseless and apniec and asystole on monitor, no heart sounds and patient pronounced dead by Marcos Eke, RN and Rudie Meyer, RN at [phone] on 12/14/2011. Family at bedside at time of death. Dr. Renard Matter notified, Haynes Hoehn notified; as well as, bed control. Dory Larsen came as well as other family members. Bed control will be contacting [facility] for transport.

## 2011-12-16 DEATH — deceased

## 2011-12-24 IMAGING — CR DG CHEST 1V PORT
1 series · 1 of 1 positions shown · non-contrast
Comparison: 02/22/2007

CLINICAL DATA: PICC line placement

PORTABLE CHEST - 1 VIEW

[view not recorded]
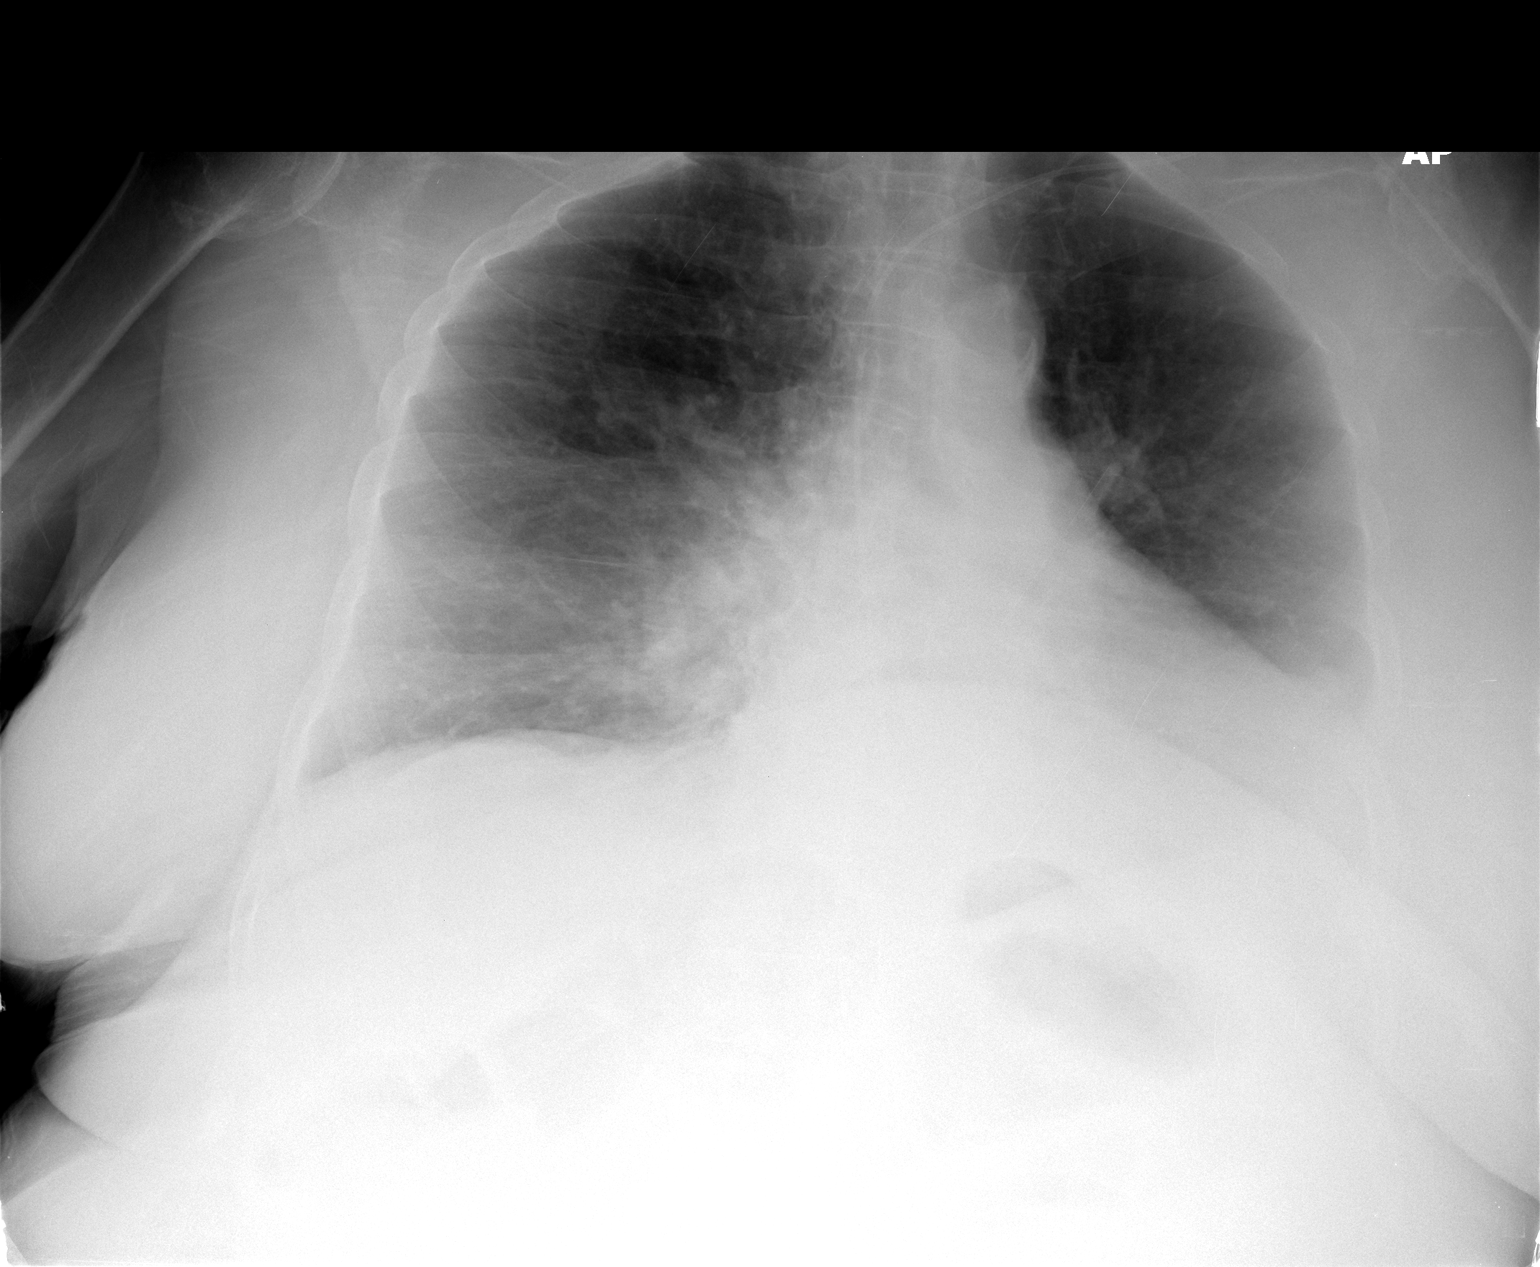

[1 of 1 positions shown; findings below may reference images not displayed]

FINDINGS: Cardiomegaly noted.  The patient is rotated to the right.
Study is limited by patient body habitus.  There is a left arm PICC
line with tip in right atrium.  There is no diagnostic
pneumothorax.  No acute infiltrate or convincing edema.
IMPRESSION: No acute infiltrate.  Left arm PICC line with tip in the right
atrium.  No diagnostic pneumothorax.

## 2012-01-08 NOTE — Discharge Summary (Signed)
[name], [name]              ACCOUNT NO.:  [id number]  MEDICAL RECORD NO.:  [id number]  LOCATION:  IC05                          FACILITY:  [facility]  PHYSICIAN:  Mila Homer. Sudie Bailey, M.D.DATE OF BIRTH:  [birth year]  DATE OF ADMISSION:  11/26/2011 DATE OF DISCHARGE:  03-17-2013LH                              DISCHARGE SUMMARY   DEATH SUMMARY:  This is a 78 year old woman with a history of renal failure, dialysis, chronic infection of the left knee.  She was brought to the [facility] emergency room on February 10 due to increasing unresponsiveness.  She had a total knee replacement on the left which became infected and subsequently was replaced, I believe at [facility].  She then had infection in this knee which was also replaced.  She was not ready to have a 3rd replacement, and there seemed to be chronic infection.  She stayed in a nursing home but then was tried at home, but she just could not stay home on her own, so she was transferred back to the nursing facility.  This has been her major problem, but then in of the last year she also developed worsening renal failure to the point that she needed renal dialysis 3 times a week.  She has also had lifetime asthma, reactive depression, and benign essential hypertension.  MEDICATIONS:  Her medications when she came into the hospital included acetaminophen, Benadryl, Norco, ibuprofen, promethazine, Ambien, albuterol inhaler, baclofen, Detrol, and loratadine.  LABORATORIES AT THE TIME OF ADMISSION:  She had a white blood cell count of 27,800 of which 92% were neutrophils.  Hemoglobin was 8.3.  She had a potassium of 3.0, BUN 27, creatinine 3.56.  Blood tests were followed during her hospitalization.  Her potassium dropped to 2.7 at one point.  The cardiac profile was negative.  White blood cell count stay here 20,000 her whole hospitalization, however.  Blood cultures x2 on admission grew out  methicillin-resistant Staph aureus. Admission UA showed specific gravity 1.015, large leukocytes with 21-50 wbc's, 7-10 rbc's per HPF and many bacteria.  His urine grew out greater 100,000 colonies of Providencia stuartii enterococcus species.  Her initial blood cultures x2 were negative at 5 days with no growth.  It was only after the 2nd set were done 2 days after that the methicillin- resistant Staph was noted.  Her MRSA screen by PCR on nasal specimen was positive as well.  HOSPITAL COURSE:  On admission to the hospital, she was felt to have a urinary tract infection, and change in mental status secondary to this. She was also noted to have end-stage renal failure, anemia of chronic disease, protein calorie malnutrition and history of the left hip replacement.  She was put on Rocephin, and nephrology was called in.  The next day when I saw her, she was not responding to anything I could say but was able to shake her head.  Shortly after this, I was called by Nursing because the patient was being coded.  She pulled through  the cardiopulmonary arrest and was transferred to the ICU at that point. Dr. Juanetta Gosling, pulmonologist, was called in on consult as was Dr. Gerilyn Pilgrim,  neurologist.  She spent  the rest of her for hospital stay in the ICU.  After this code, she showed no neurological response.  Even though she was on a ventilator, she required no medication to calm her down.  During this time, she was treated with Rocephin initially, and, given her history of MRSA carriage, she was put on vancomycin IV.  She was then continued on ceftriaxone IV and then Zosyn IV.  She also received lorazepam IV, heparin IV, pantoprazole IV, and Keppra IV.  After 2 days of being in the ICU with no signs of neurological response, conversation was had with the family, Dr. Juanetta Gosling, Dr. Gerilyn Pilgrim and myself.  The ventilator was stopped on Friday, February 17, and within 12 hours she quietly expired in  the ICU.  FINAL DISCHARGE DIAGNOSES: 1. Methicillin-resistant Staphylococcus aureus septicemia. 2. Mental status changes secondary to overwhelming sepsis. 3. Urinary tract infection secondary to Proteus mirabilis     Enterococcus. 4. Anoxic encephalopathy secondary to cardiopulmonary arrest. 5. Chronic renal insufficiency on hemodialysis. 6. Chronic infection of the left knee, now status post total knees x2     with removal of the same. 7. Benign essential hypertension. 8. Chronic reactive depression. 9. History of asthma.     Mila Homer. Sudie Bailey, M.D.     SDK/MEDQ  D:  01/08/2012  T:  01/08/2012  Job:  [id number]

## 2012-09-24 IMAGING — CR DG KNEE 1-2V*L*
4 series · 4 of 4 positions shown · non-contrast
Comparison: None

CLINICAL DATA: Abnormal bone scan, leukocytosis, history of
osteomyelitis, infected knee replacement, removed

LEFT KNEE - 1-2 VIEW

[view not recorded (1 of 4)]
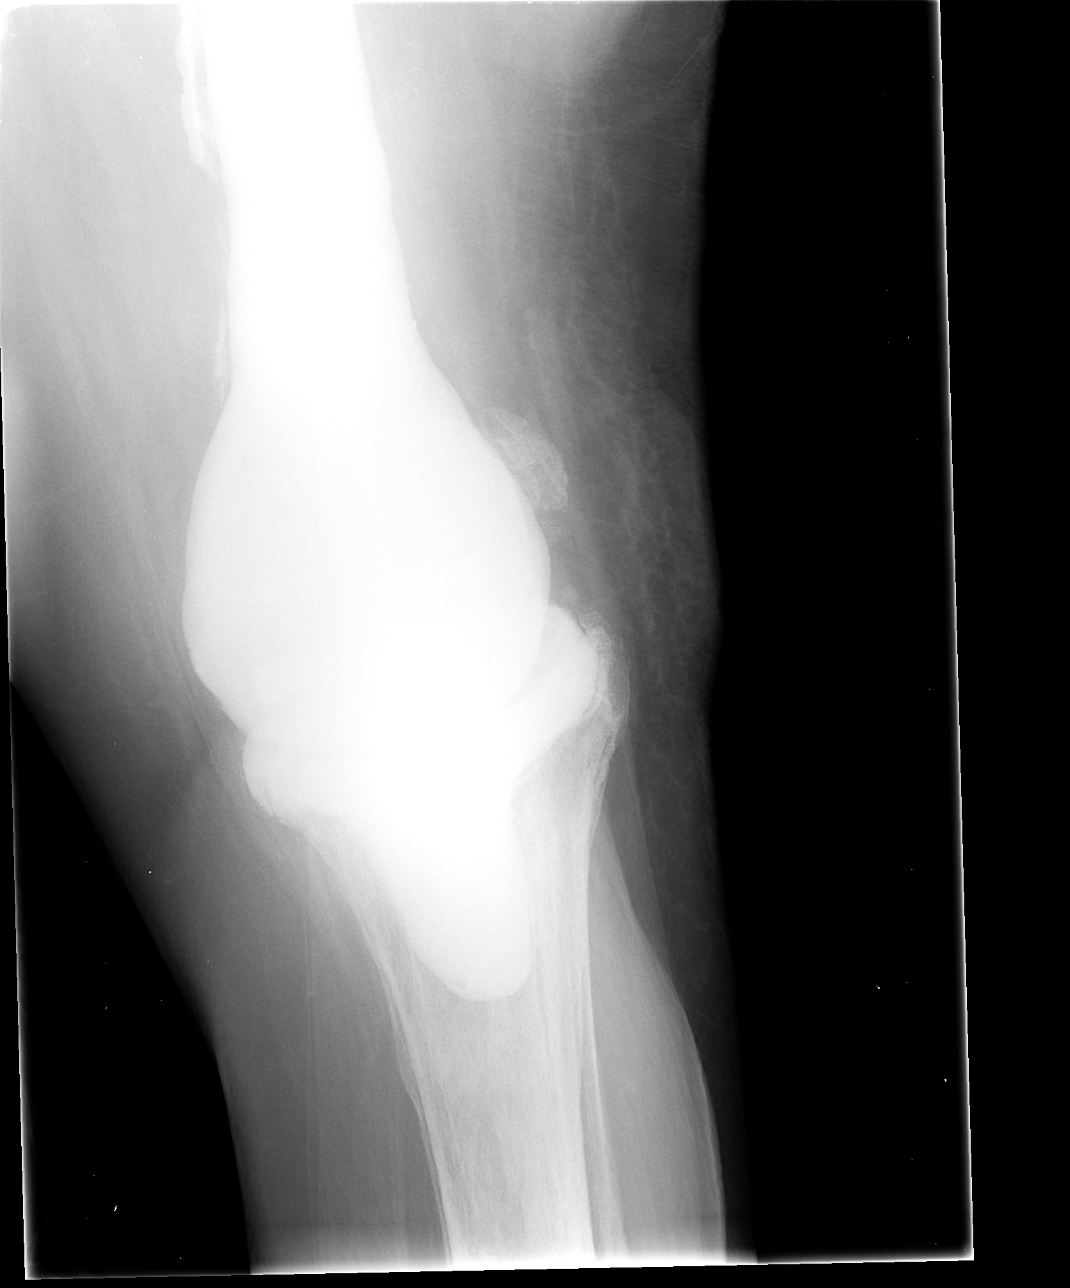

[view not recorded (2 of 4)]
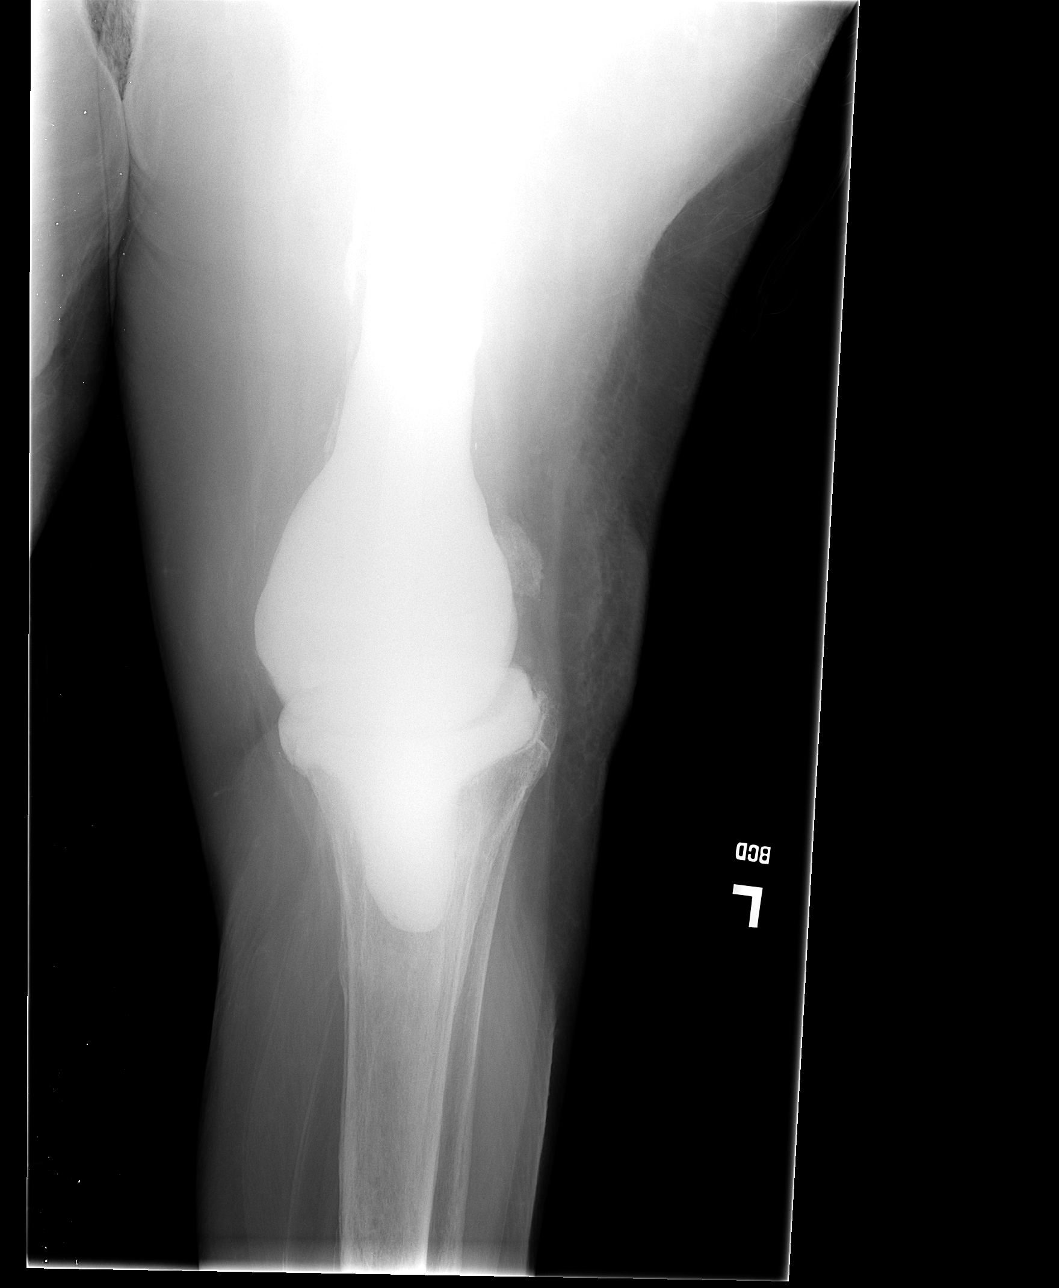

[view not recorded (3 of 4)]
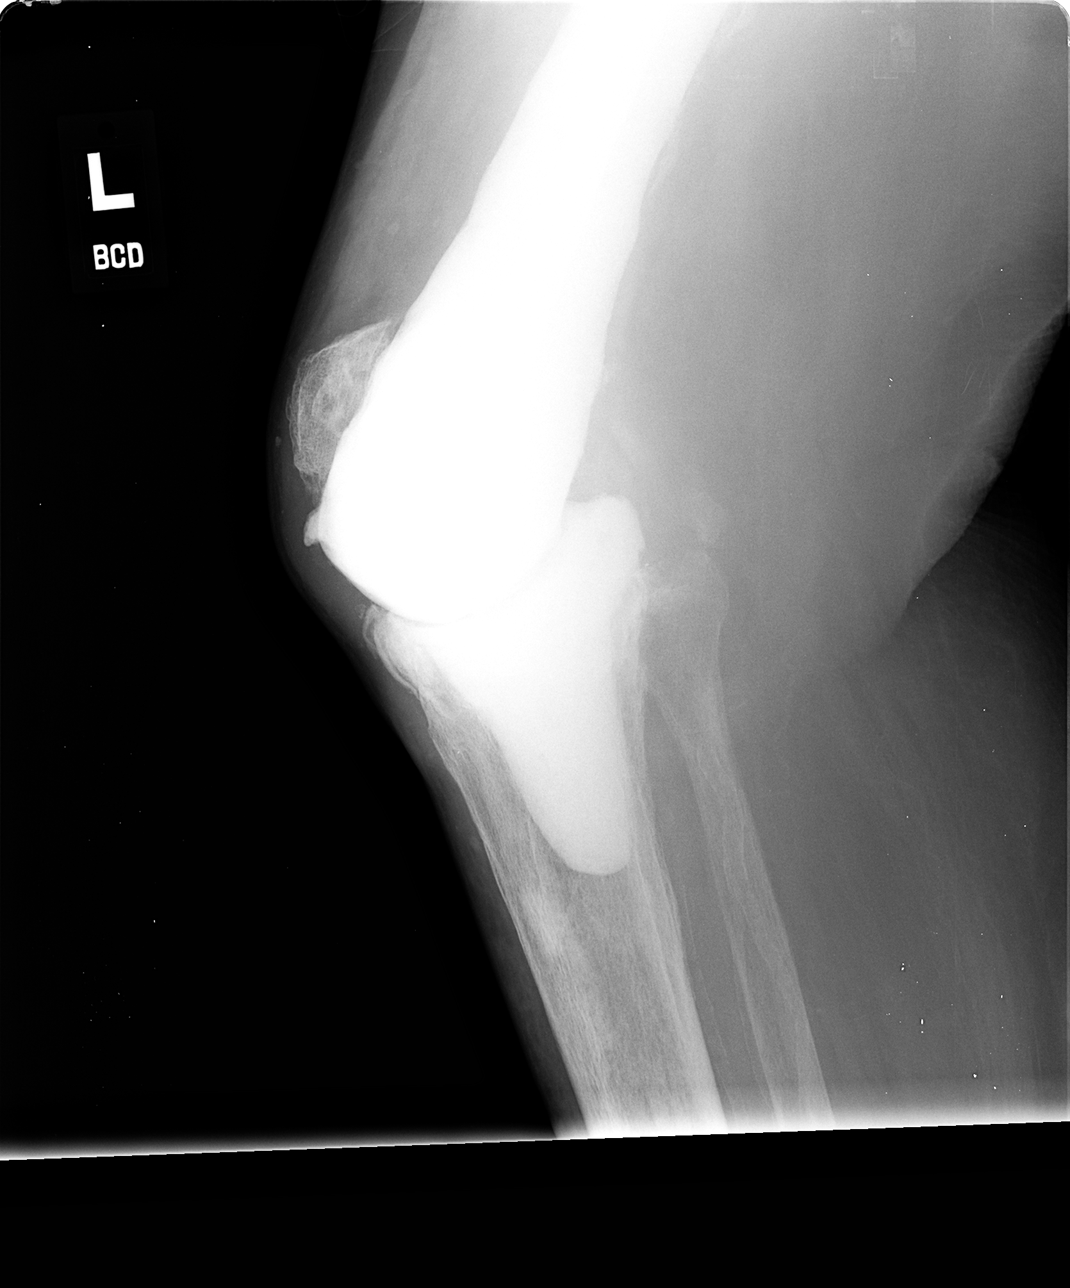

[view not recorded (4 of 4)]
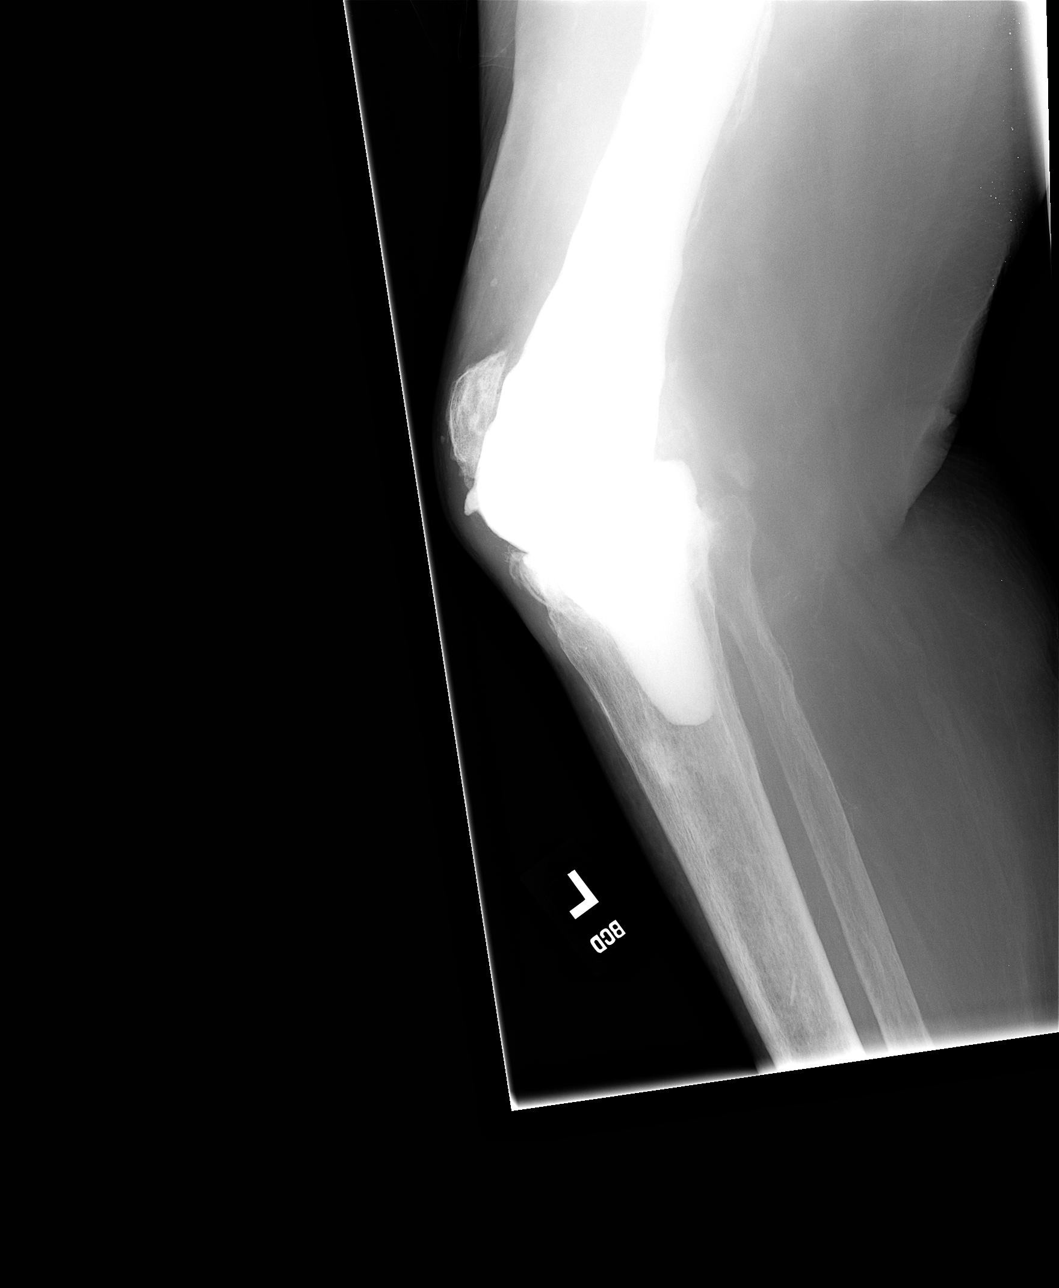

[4 of 4 positions shown; findings below may reference images not displayed]

FINDINGS: Methacrylate left knee prosthesis is present.
Tibial component appears normally located with questionably minimal
lucency adjacent to the lateral margin.
Femoral methacrylate component has what appears to be a central
tubular density question IM nail.
Minimal periosteal bone adjacent to proximal portion of
methacrylate prosthesis.
At the junction of the methacrylate prosthesis and the femoral
diaphysis, bony overgrowth is identified with intervening lucency,
which may be the result of chronic bone reaction from either
infection or motion/loosening.
Patella appears subluxed laterally.
Regional soft tissue swelling.
Diffuse osseous demineralization.
Mature periosteal new bone identified along the proximal tibia.
No acute fracture or prosthetic dislocation.
IMPRESSION: Replacement of left knee prosthesis within a methacrylate
prosthesis.
Lucency at the junction of the femoral methacrylate with the
femoral diaphysis, which may be result of loosening or infection.
Minimal lucency surrounding the tibial component of the
methacrylate, which could represent loosening or infection, or be
postsurgical in origin; the absence of uptake on bone scan at this
site suggests this is postsurgical rather than inflammatory in
origin.

## 2013-04-18 IMAGING — CR DG CHEST 1V
1 series · 1 of 1 positions shown · non-contrast
Comparison: 02/19/2011

CLINICAL DATA: Altered mental status

CHEST - 1 VIEW

[view not recorded]
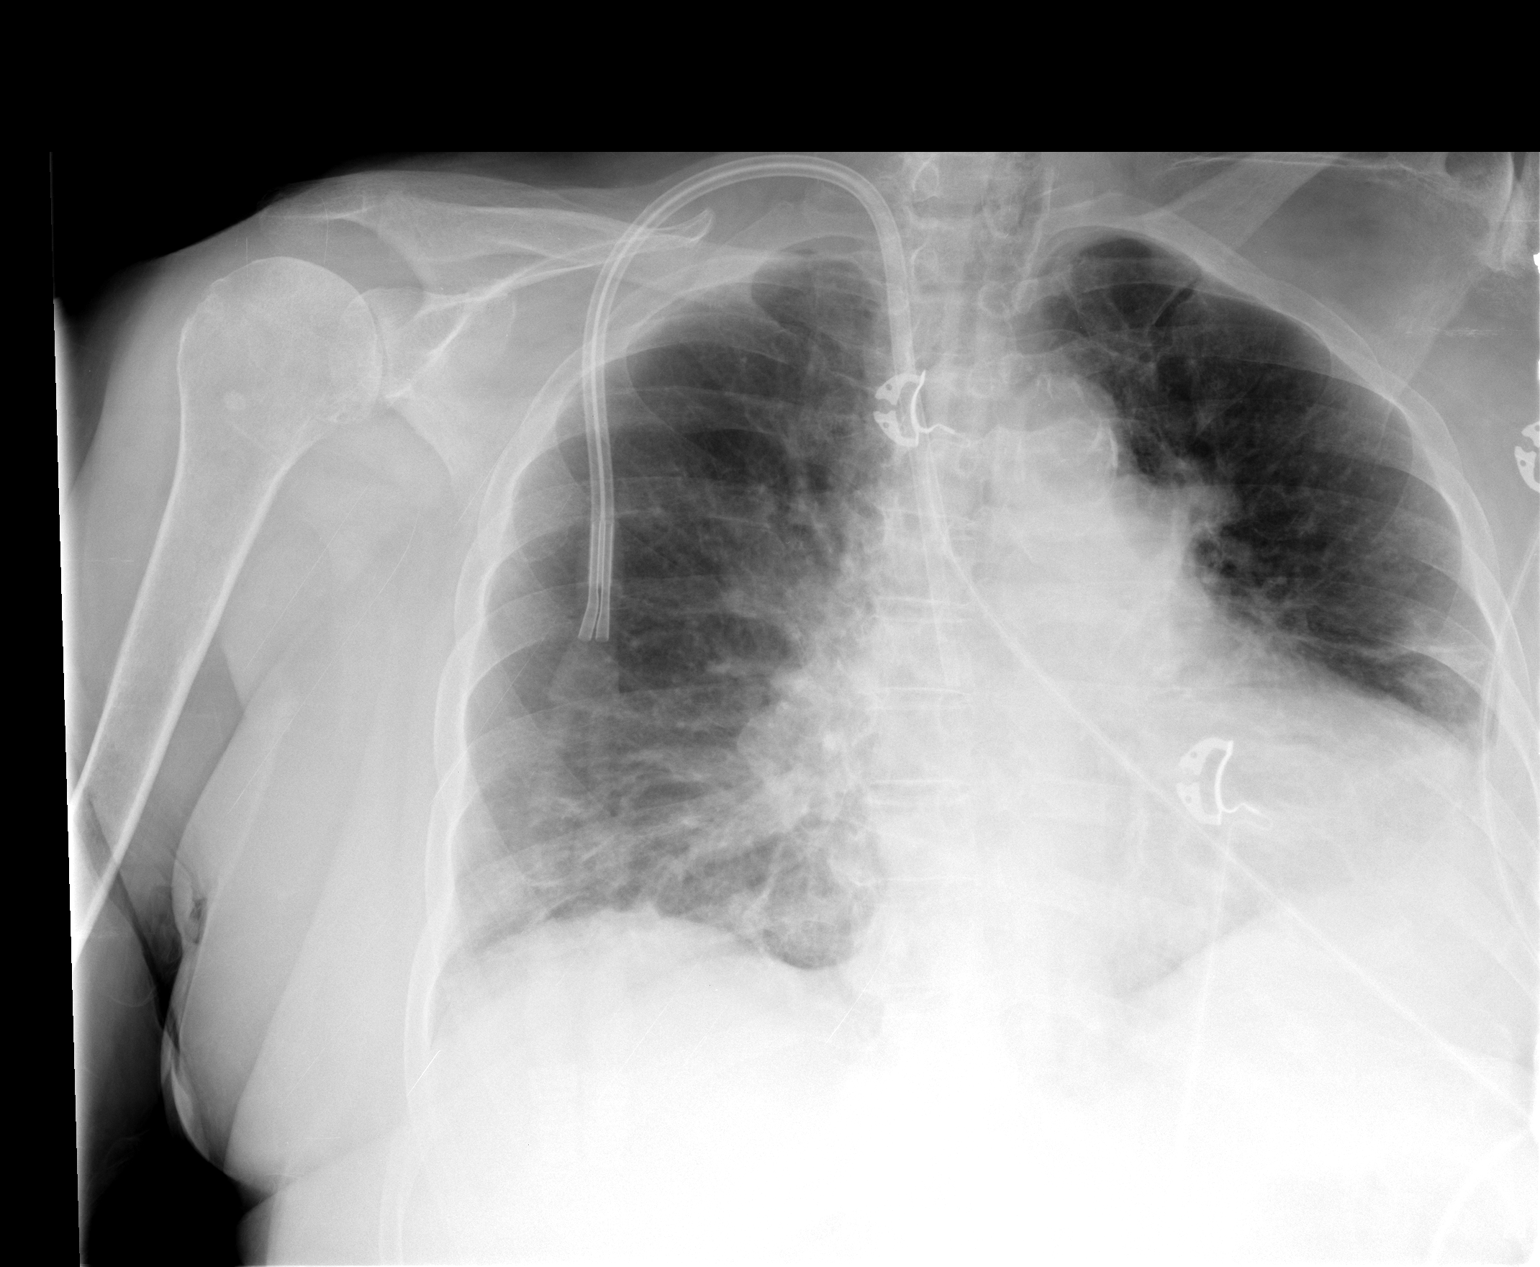

[1 of 1 positions shown; findings below may reference images not displayed]

FINDINGS: Left costophrenic angle is excluded from this single
image.

Mild lingular scarring.  Bilateral lower lobe opacities, likely
scarring versus atelectasis.

No pneumothorax.

Mediastinal silhouette is stable.  Right sided dual lumen dialysis
catheter.
IMPRESSION: No evidence of acute cardiopulmonary disease.

Mild lingular scarring.  Bilateral lower lobe opacities, likely
scarring versus atelectasis.

## 2013-04-19 IMAGING — CR DG CHEST 1V PORT
1 series · 1 of 1 positions shown · non-contrast
Comparison: 11/27/2011

CLINICAL DATA: Cardiac arrest status post intubation.

PORTABLE CHEST - 1 VIEW

[view not recorded]
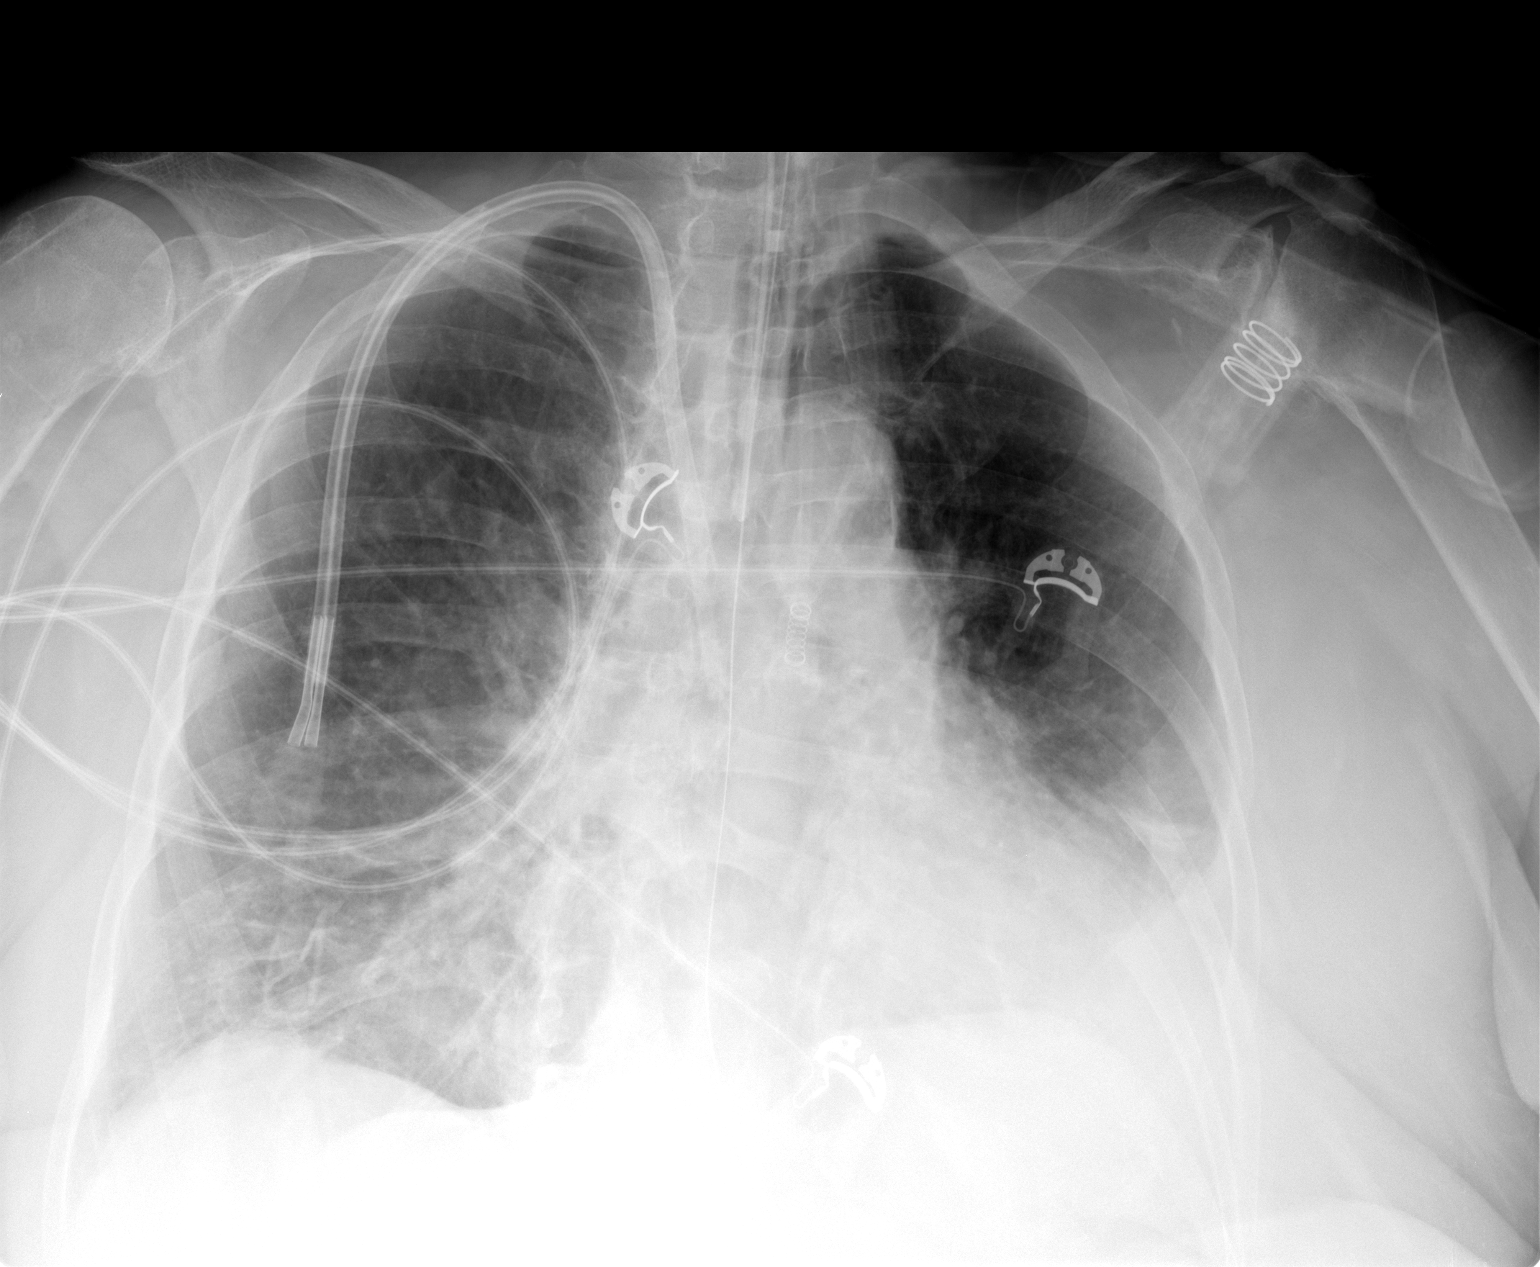

[1 of 1 positions shown; findings below may reference images not displayed]

FINDINGS: Dialysis catheter tip SVC/RA junction.  Cardiomegaly.
Left pleural effusion moderate sized.  Early pulmonary edema.
Nasogastric tube overlies the endotracheal tube.  Endotracheal tube
tip felt to lie 5.6 cm above carina.  No pneumothorax. Worsening
aeration.
IMPRESSION: ET tube tip 5.6 cm above carina.  NG tube.  Cardiomegaly with early
pulmonary edema.  Left pleural effusion.

## 2013-04-21 IMAGING — CR DG CHEST 1V PORT
1 series · 1 of 1 positions shown · non-contrast
Comparison: Chest 11/29/2011 and [DATE].

CLINICAL DATA: Respiratory failure.

PORTABLE CHEST - 1 VIEW

[view not recorded]
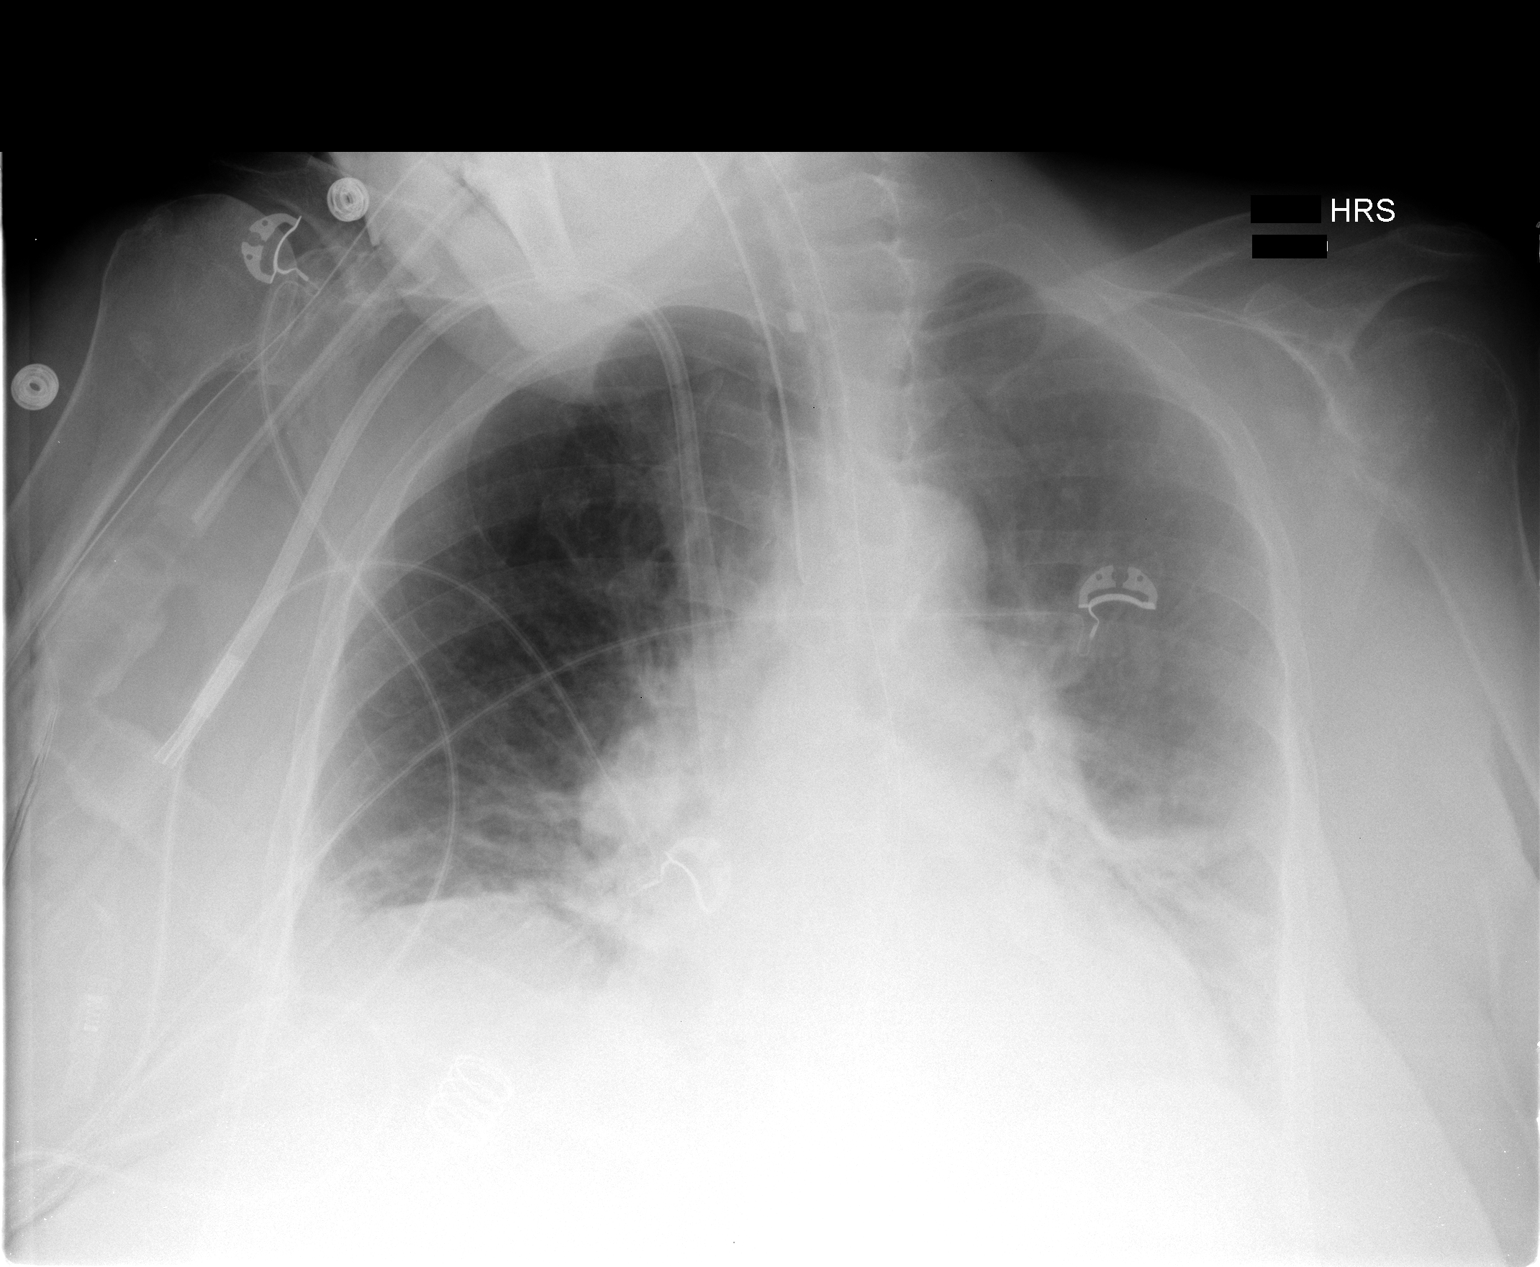

[1 of 1 positions shown; findings below may reference images not displayed]

FINDINGS: Support tubes and lines are unchanged.  Bibasilar
airspace disease, greater on the left, persist with some increase
in left basilar atelectasis.  There is likely a left pleural
effusion.  No pneumothorax identified.  Cardiomegaly is noted.
IMPRESSION: Some increase in left effusion and basilar airspace disease.
Subsegmental atelectasis in the right base is not notably changed.

## 2015-02-08 NOTE — Op Note (Signed)
PATIENT NAME:  April AbideCAPPS, [name] MR#:  [id number] DATE OF BIRTH:  [birth year]  DATE OF PROCEDURE:  10/19/2011  PREOPERATIVE DIAGNOSES:  1. Complication AV fistula, inability to cannulate.  2. End-stage renal disease requiring hemodialysis.   POSTOPERATIVE DIAGNOSES:  1. Complication AV fistula, inability to cannulate.  2. End-stage renal disease requiring hemodialysis.   PROCEDURE PERFORMED: Contrast injection left arm fistula, brachiocephalic.   SURGEON: Levora DredgeGregory Herb Beltre, M.D.   ANESTHESIA:  Sedation Versed 2 mg plus fentanyl 50 mcg administered IV. Continuous ECG, pulse oximetry and cardiopulmonary monitoring was performed throughout the entire procedure by the interventional radiology nurse. Total sedation time was 30 minutes.   ACCESS: 6 French sheath antegrade direction, left arm brachiocephalic fistula.   CONTRAST USED: Isovue 15 mL.   FLUOROSCOPY TIME: 0.5 minutes.   INDICATIONS: Ms. Lyndal RainbowCapps is a 81 year old woman who underwent creation of a left brachiocephalic fistula approximately 2-1/2 months ago. They have been unable to cannulate it although it appears very large and dilated in its proximal 3 centimeters. It seems to disappear after that and it is not easily palpated. Risks and benefits for contrast injection of the fistula were reviewed. All questions are answered. The patient agrees to proceed.   DESCRIPTION OF PROCEDURE: The patient is taken to special procedures and placed in the supine position. After adequate sedation is achieved, the left arm is positioned palm upward and prepped and draped in sterile fashion. 1% lidocaine is infiltrated in the soft tissues overlying the fistula in its easily visualized segment in near the arterial anastomosis. The micro sheath is inserted. J-wire is advanced followed by placement of a 6 French sheath. Hand injection of contrast is then utilized to demonstrate the fistula. After review of the images, pursestring suture of 4-0 Monocryl was  placed around the sheath and is removed and there are no immediate complications.   INTERPRETATION: The cephalic venous outflow appears to be widely patent and measuring 8 to 10 mm in diameter. There are two fairly significant side branches in its midportion. There appears to be a little irregularity at the confluence of the cephalic vein with the subclavian; however, the J-wire passed through this spot easily. Central veins are all widely patent.   SUMMARY: Widely patent fistula with a nice size vein that appears to be too deep to allow for easy cannulation. Recommendation is for surgical revision with elevation of the fistula to make it easy to cannulate.  ____________________________ Renford DillsGregory G. Jayzen Paver, MD ggs:rbg D: 10/19/2011 11:35:20 ET T: 10/19/2011 13:23:34 ET JOB#: [id number]  cc: Renford DillsGregory G. Bellah Alia, MD, <Dictator> Renford DillsGREGORY G Deangelo Berns MD ELECTRONICALLY SIGNED 10/19/2011 16:46
